# Patient Record
Sex: Male | Born: 1955
Health system: Southern US, Community
[De-identification: ages and names within clinical notes are randomized; demographics above are authoritative.]

## PROBLEM LIST (undated history)

## (undated) DIAGNOSIS — E119 Type 2 diabetes mellitus without complications: Secondary | ICD-10-CM

## (undated) DIAGNOSIS — H548 Legal blindness, as defined in USA: Secondary | ICD-10-CM

## (undated) DIAGNOSIS — M5030 Other cervical disc degeneration, unspecified cervical region: Secondary | ICD-10-CM

## (undated) DIAGNOSIS — J449 Chronic obstructive pulmonary disease, unspecified: Secondary | ICD-10-CM

---

## 2009-05-10 ENCOUNTER — Encounter: Admission: RE | Admit: 2009-05-10 | Discharge: 2009-05-10 | Payer: Self-pay | Admitting: Neurosurgery

## 2019-04-12 DIAGNOSIS — M48062 Spinal stenosis, lumbar region with neurogenic claudication: Secondary | ICD-10-CM | POA: Diagnosis not present

## 2019-04-12 DIAGNOSIS — F17210 Nicotine dependence, cigarettes, uncomplicated: Secondary | ICD-10-CM | POA: Diagnosis not present

## 2019-04-12 DIAGNOSIS — J439 Emphysema, unspecified: Secondary | ICD-10-CM | POA: Diagnosis not present

## 2019-04-12 DIAGNOSIS — J449 Chronic obstructive pulmonary disease, unspecified: Secondary | ICD-10-CM | POA: Diagnosis not present

## 2019-04-12 DIAGNOSIS — Z299 Encounter for prophylactic measures, unspecified: Secondary | ICD-10-CM | POA: Diagnosis not present

## 2019-04-12 DIAGNOSIS — I10 Essential (primary) hypertension: Secondary | ICD-10-CM | POA: Diagnosis not present

## 2019-04-14 DIAGNOSIS — H2513 Age-related nuclear cataract, bilateral: Secondary | ICD-10-CM | POA: Diagnosis not present

## 2019-04-14 DIAGNOSIS — H353213 Exudative age-related macular degeneration, right eye, with inactive scar: Secondary | ICD-10-CM | POA: Diagnosis not present

## 2019-04-14 DIAGNOSIS — H353221 Exudative age-related macular degeneration, left eye, with active choroidal neovascularization: Secondary | ICD-10-CM | POA: Diagnosis not present

## 2019-05-12 DIAGNOSIS — H353221 Exudative age-related macular degeneration, left eye, with active choroidal neovascularization: Secondary | ICD-10-CM | POA: Diagnosis not present

## 2019-05-12 DIAGNOSIS — H353213 Exudative age-related macular degeneration, right eye, with inactive scar: Secondary | ICD-10-CM | POA: Diagnosis not present

## 2019-05-12 DIAGNOSIS — H2513 Age-related nuclear cataract, bilateral: Secondary | ICD-10-CM | POA: Diagnosis not present

## 2019-05-13 DIAGNOSIS — M48062 Spinal stenosis, lumbar region with neurogenic claudication: Secondary | ICD-10-CM | POA: Diagnosis not present

## 2019-05-13 DIAGNOSIS — F17210 Nicotine dependence, cigarettes, uncomplicated: Secondary | ICD-10-CM | POA: Diagnosis not present

## 2019-05-13 DIAGNOSIS — D692 Other nonthrombocytopenic purpura: Secondary | ICD-10-CM | POA: Diagnosis not present

## 2019-05-13 DIAGNOSIS — I10 Essential (primary) hypertension: Secondary | ICD-10-CM | POA: Diagnosis not present

## 2019-05-13 DIAGNOSIS — H353291 Exudative age-related macular degeneration, unspecified eye, with active choroidal neovascularization: Secondary | ICD-10-CM | POA: Diagnosis not present

## 2019-05-13 DIAGNOSIS — Z299 Encounter for prophylactic measures, unspecified: Secondary | ICD-10-CM | POA: Diagnosis not present

## 2019-06-10 DIAGNOSIS — J439 Emphysema, unspecified: Secondary | ICD-10-CM | POA: Diagnosis not present

## 2019-06-10 DIAGNOSIS — F17210 Nicotine dependence, cigarettes, uncomplicated: Secondary | ICD-10-CM | POA: Diagnosis not present

## 2019-06-10 DIAGNOSIS — I10 Essential (primary) hypertension: Secondary | ICD-10-CM | POA: Diagnosis not present

## 2019-06-10 DIAGNOSIS — Z299 Encounter for prophylactic measures, unspecified: Secondary | ICD-10-CM | POA: Diagnosis not present

## 2019-06-10 DIAGNOSIS — M48062 Spinal stenosis, lumbar region with neurogenic claudication: Secondary | ICD-10-CM | POA: Diagnosis not present

## 2019-06-10 DIAGNOSIS — J449 Chronic obstructive pulmonary disease, unspecified: Secondary | ICD-10-CM | POA: Diagnosis not present

## 2019-06-14 DIAGNOSIS — H2513 Age-related nuclear cataract, bilateral: Secondary | ICD-10-CM | POA: Diagnosis not present

## 2019-06-14 DIAGNOSIS — H353231 Exudative age-related macular degeneration, bilateral, with active choroidal neovascularization: Secondary | ICD-10-CM | POA: Diagnosis not present

## 2019-06-25 DIAGNOSIS — F17210 Nicotine dependence, cigarettes, uncomplicated: Secondary | ICD-10-CM | POA: Diagnosis not present

## 2019-06-25 DIAGNOSIS — J449 Chronic obstructive pulmonary disease, unspecified: Secondary | ICD-10-CM | POA: Diagnosis not present

## 2019-06-25 DIAGNOSIS — I10 Essential (primary) hypertension: Secondary | ICD-10-CM | POA: Diagnosis not present

## 2019-06-25 DIAGNOSIS — H353291 Exudative age-related macular degeneration, unspecified eye, with active choroidal neovascularization: Secondary | ICD-10-CM | POA: Diagnosis not present

## 2019-06-25 DIAGNOSIS — Z299 Encounter for prophylactic measures, unspecified: Secondary | ICD-10-CM | POA: Diagnosis not present

## 2019-07-12 DIAGNOSIS — F17210 Nicotine dependence, cigarettes, uncomplicated: Secondary | ICD-10-CM | POA: Diagnosis not present

## 2019-07-12 DIAGNOSIS — I10 Essential (primary) hypertension: Secondary | ICD-10-CM | POA: Diagnosis not present

## 2019-07-12 DIAGNOSIS — H353291 Exudative age-related macular degeneration, unspecified eye, with active choroidal neovascularization: Secondary | ICD-10-CM | POA: Diagnosis not present

## 2019-07-12 DIAGNOSIS — J449 Chronic obstructive pulmonary disease, unspecified: Secondary | ICD-10-CM | POA: Diagnosis not present

## 2019-07-12 DIAGNOSIS — M48062 Spinal stenosis, lumbar region with neurogenic claudication: Secondary | ICD-10-CM | POA: Diagnosis not present

## 2019-07-12 DIAGNOSIS — Z299 Encounter for prophylactic measures, unspecified: Secondary | ICD-10-CM | POA: Diagnosis not present

## 2019-07-14 DIAGNOSIS — H2513 Age-related nuclear cataract, bilateral: Secondary | ICD-10-CM | POA: Diagnosis not present

## 2019-07-14 DIAGNOSIS — H353231 Exudative age-related macular degeneration, bilateral, with active choroidal neovascularization: Secondary | ICD-10-CM | POA: Diagnosis not present

## 2019-07-30 DIAGNOSIS — E781 Pure hyperglyceridemia: Secondary | ICD-10-CM | POA: Diagnosis not present

## 2019-07-30 DIAGNOSIS — I10 Essential (primary) hypertension: Secondary | ICD-10-CM | POA: Diagnosis not present

## 2019-08-11 DIAGNOSIS — H2513 Age-related nuclear cataract, bilateral: Secondary | ICD-10-CM | POA: Diagnosis not present

## 2019-08-11 DIAGNOSIS — H353231 Exudative age-related macular degeneration, bilateral, with active choroidal neovascularization: Secondary | ICD-10-CM | POA: Diagnosis not present

## 2019-08-13 DIAGNOSIS — M48062 Spinal stenosis, lumbar region with neurogenic claudication: Secondary | ICD-10-CM | POA: Diagnosis not present

## 2019-08-13 DIAGNOSIS — Z1211 Encounter for screening for malignant neoplasm of colon: Secondary | ICD-10-CM | POA: Diagnosis not present

## 2019-08-13 DIAGNOSIS — Z79899 Other long term (current) drug therapy: Secondary | ICD-10-CM | POA: Diagnosis not present

## 2019-08-13 DIAGNOSIS — I10 Essential (primary) hypertension: Secondary | ICD-10-CM | POA: Diagnosis not present

## 2019-08-13 DIAGNOSIS — Z299 Encounter for prophylactic measures, unspecified: Secondary | ICD-10-CM | POA: Diagnosis not present

## 2019-08-13 DIAGNOSIS — Z7189 Other specified counseling: Secondary | ICD-10-CM | POA: Diagnosis not present

## 2019-08-13 DIAGNOSIS — R5383 Other fatigue: Secondary | ICD-10-CM | POA: Diagnosis not present

## 2019-08-13 DIAGNOSIS — Z0000 Encounter for general adult medical examination without abnormal findings: Secondary | ICD-10-CM | POA: Diagnosis not present

## 2019-08-13 DIAGNOSIS — E781 Pure hyperglyceridemia: Secondary | ICD-10-CM | POA: Diagnosis not present

## 2019-09-03 ENCOUNTER — Encounter (HOSPITAL_COMMUNITY): Payer: Self-pay

## 2019-09-03 ENCOUNTER — Other Ambulatory Visit: Payer: Self-pay

## 2019-09-03 ENCOUNTER — Emergency Department (HOSPITAL_COMMUNITY)
Admission: EM | Admit: 2019-09-03 | Discharge: 2019-09-03 | Disposition: A | Discharge status: Home / Self Care | Payer: Medicare Other | Attending: Emergency Medicine | Admitting: Emergency Medicine

## 2019-09-03 ENCOUNTER — Emergency Department (HOSPITAL_COMMUNITY): Payer: Medicare Other

## 2019-09-03 DIAGNOSIS — R05 Cough: Secondary | ICD-10-CM | POA: Diagnosis not present

## 2019-09-03 DIAGNOSIS — Y999 Unspecified external cause status: Secondary | ICD-10-CM | POA: Diagnosis not present

## 2019-09-03 DIAGNOSIS — S5012XA Contusion of left forearm, initial encounter: Secondary | ICD-10-CM | POA: Diagnosis not present

## 2019-09-03 DIAGNOSIS — Y9355 Activity, bike riding: Secondary | ICD-10-CM | POA: Diagnosis not present

## 2019-09-03 DIAGNOSIS — J449 Chronic obstructive pulmonary disease, unspecified: Secondary | ICD-10-CM | POA: Insufficient documentation

## 2019-09-03 DIAGNOSIS — S7012XA Contusion of left thigh, initial encounter: Secondary | ICD-10-CM | POA: Insufficient documentation

## 2019-09-03 DIAGNOSIS — Y929 Unspecified place or not applicable: Secondary | ICD-10-CM | POA: Insufficient documentation

## 2019-09-03 DIAGNOSIS — R000 Tachycardia, unspecified: Secondary | ICD-10-CM | POA: Diagnosis not present

## 2019-09-03 DIAGNOSIS — Z23 Encounter for immunization: Secondary | ICD-10-CM | POA: Diagnosis not present

## 2019-09-03 DIAGNOSIS — S299XXA Unspecified injury of thorax, initial encounter: Secondary | ICD-10-CM | POA: Diagnosis present

## 2019-09-03 DIAGNOSIS — S199XXA Unspecified injury of neck, initial encounter: Secondary | ICD-10-CM | POA: Diagnosis not present

## 2019-09-03 DIAGNOSIS — S2232XA Fracture of one rib, left side, initial encounter for closed fracture: Secondary | ICD-10-CM | POA: Diagnosis not present

## 2019-09-03 DIAGNOSIS — F17200 Nicotine dependence, unspecified, uncomplicated: Secondary | ICD-10-CM | POA: Diagnosis not present

## 2019-09-03 HISTORY — DX: Legal blindness, as defined in USA: H54.8

## 2019-09-03 HISTORY — DX: Other cervical disc degeneration, unspecified cervical region: M50.30

## 2019-09-03 HISTORY — DX: Chronic obstructive pulmonary disease, unspecified: J44.9

## 2019-09-03 MED ORDER — TETANUS-DIPHTH-ACELL PERTUSSIS 5-2.5-18.5 LF-MCG/0.5 IM SUSP
0.5000 mL | Freq: Once | INTRAMUSCULAR | Status: AC
  Administered 2019-09-03: 0.5 mL via INTRAMUSCULAR
  Filled 2019-09-03: qty 0.5

## 2019-09-03 MED ORDER — OXYCODONE-ACETAMINOPHEN 5-325 MG PO TABS: 1.0000 | ORAL_TABLET | ORAL | 0 refills | Status: AC | PRN
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
## 2019-09-03 NOTE — Discharge Instructions (Addendum)
Return if any problems.  See your Physician for recheck.  Stop hydrocodone  Take percocet for pain.  Discuss on going pain management with your MD

## 2019-09-03 NOTE — ED Triage Notes (Signed)
Pt fell last weekend and believes he has a broken rib. Pt is in NAD. Left rib pain.

## 2019-09-07 DIAGNOSIS — Z299 Encounter for prophylactic measures, unspecified: Secondary | ICD-10-CM | POA: Diagnosis not present

## 2019-09-07 DIAGNOSIS — M48062 Spinal stenosis, lumbar region with neurogenic claudication: Secondary | ICD-10-CM | POA: Diagnosis not present

## 2019-09-07 DIAGNOSIS — R0602 Shortness of breath: Secondary | ICD-10-CM | POA: Diagnosis not present

## 2019-09-07 DIAGNOSIS — J449 Chronic obstructive pulmonary disease, unspecified: Secondary | ICD-10-CM | POA: Diagnosis not present

## 2019-09-07 DIAGNOSIS — F17210 Nicotine dependence, cigarettes, uncomplicated: Secondary | ICD-10-CM | POA: Diagnosis not present

## 2019-09-07 DIAGNOSIS — S2232XA Fracture of one rib, left side, initial encounter for closed fracture: Secondary | ICD-10-CM | POA: Diagnosis not present

## 2019-09-08 DIAGNOSIS — H353231 Exudative age-related macular degeneration, bilateral, with active choroidal neovascularization: Secondary | ICD-10-CM | POA: Diagnosis not present

## 2019-09-08 DIAGNOSIS — H43811 Vitreous degeneration, right eye: Secondary | ICD-10-CM | POA: Diagnosis not present

## 2019-09-08 DIAGNOSIS — H2513 Age-related nuclear cataract, bilateral: Secondary | ICD-10-CM | POA: Diagnosis not present

## 2019-09-13 DIAGNOSIS — J439 Emphysema, unspecified: Secondary | ICD-10-CM | POA: Diagnosis not present

## 2019-09-13 DIAGNOSIS — J449 Chronic obstructive pulmonary disease, unspecified: Secondary | ICD-10-CM | POA: Diagnosis not present

## 2019-09-13 DIAGNOSIS — Z981 Arthrodesis status: Secondary | ICD-10-CM | POA: Diagnosis not present

## 2019-09-13 DIAGNOSIS — F17210 Nicotine dependence, cigarettes, uncomplicated: Secondary | ICD-10-CM | POA: Diagnosis not present

## 2019-09-13 DIAGNOSIS — Z299 Encounter for prophylactic measures, unspecified: Secondary | ICD-10-CM | POA: Diagnosis not present

## 2019-09-13 DIAGNOSIS — H353291 Exudative age-related macular degeneration, unspecified eye, with active choroidal neovascularization: Secondary | ICD-10-CM | POA: Diagnosis not present

## 2019-09-13 DIAGNOSIS — I10 Essential (primary) hypertension: Secondary | ICD-10-CM | POA: Diagnosis not present

## 2019-09-29 DIAGNOSIS — Z720 Tobacco use: Secondary | ICD-10-CM | POA: Diagnosis not present

## 2019-09-29 DIAGNOSIS — I10 Essential (primary) hypertension: Secondary | ICD-10-CM | POA: Diagnosis not present

## 2019-09-29 DIAGNOSIS — E785 Hyperlipidemia, unspecified: Secondary | ICD-10-CM | POA: Diagnosis not present

## 2019-09-29 DIAGNOSIS — J449 Chronic obstructive pulmonary disease, unspecified: Secondary | ICD-10-CM | POA: Diagnosis not present

## 2019-10-11 DIAGNOSIS — B351 Tinea unguium: Secondary | ICD-10-CM | POA: Diagnosis not present

## 2019-10-11 DIAGNOSIS — I10 Essential (primary) hypertension: Secondary | ICD-10-CM | POA: Diagnosis not present

## 2019-10-11 DIAGNOSIS — F17210 Nicotine dependence, cigarettes, uncomplicated: Secondary | ICD-10-CM | POA: Diagnosis not present

## 2019-10-11 DIAGNOSIS — M48062 Spinal stenosis, lumbar region with neurogenic claudication: Secondary | ICD-10-CM | POA: Diagnosis not present

## 2019-10-11 DIAGNOSIS — Z299 Encounter for prophylactic measures, unspecified: Secondary | ICD-10-CM | POA: Diagnosis not present

## 2019-10-11 DIAGNOSIS — H353291 Exudative age-related macular degeneration, unspecified eye, with active choroidal neovascularization: Secondary | ICD-10-CM | POA: Diagnosis not present

## 2019-10-13 DIAGNOSIS — H353231 Exudative age-related macular degeneration, bilateral, with active choroidal neovascularization: Secondary | ICD-10-CM | POA: Diagnosis not present

## 2019-10-13 DIAGNOSIS — H353221 Exudative age-related macular degeneration, left eye, with active choroidal neovascularization: Secondary | ICD-10-CM | POA: Diagnosis not present

## 2019-10-13 DIAGNOSIS — H43811 Vitreous degeneration, right eye: Secondary | ICD-10-CM | POA: Diagnosis not present

## 2019-10-13 DIAGNOSIS — H353211 Exudative age-related macular degeneration, right eye, with active choroidal neovascularization: Secondary | ICD-10-CM | POA: Diagnosis not present

## 2019-10-13 DIAGNOSIS — H2513 Age-related nuclear cataract, bilateral: Secondary | ICD-10-CM | POA: Diagnosis not present

## 2019-10-29 DIAGNOSIS — M1711 Unilateral primary osteoarthritis, right knee: Secondary | ICD-10-CM | POA: Diagnosis not present

## 2019-10-29 DIAGNOSIS — I10 Essential (primary) hypertension: Secondary | ICD-10-CM | POA: Diagnosis not present

## 2019-10-29 DIAGNOSIS — Z720 Tobacco use: Secondary | ICD-10-CM | POA: Diagnosis not present

## 2019-10-29 DIAGNOSIS — J449 Chronic obstructive pulmonary disease, unspecified: Secondary | ICD-10-CM | POA: Diagnosis not present

## 2019-11-02 DIAGNOSIS — I739 Peripheral vascular disease, unspecified: Secondary | ICD-10-CM | POA: Diagnosis not present

## 2019-11-02 DIAGNOSIS — L110 Acquired keratosis follicularis: Secondary | ICD-10-CM | POA: Diagnosis not present

## 2019-11-02 DIAGNOSIS — M79672 Pain in left foot: Secondary | ICD-10-CM | POA: Diagnosis not present

## 2019-11-02 DIAGNOSIS — M79671 Pain in right foot: Secondary | ICD-10-CM | POA: Diagnosis not present

## 2019-11-02 DIAGNOSIS — M79674 Pain in right toe(s): Secondary | ICD-10-CM | POA: Diagnosis not present

## 2019-11-11 DIAGNOSIS — Z299 Encounter for prophylactic measures, unspecified: Secondary | ICD-10-CM | POA: Diagnosis not present

## 2019-11-11 DIAGNOSIS — I700 Atherosclerosis of aorta: Secondary | ICD-10-CM | POA: Diagnosis not present

## 2019-11-11 DIAGNOSIS — R5383 Other fatigue: Secondary | ICD-10-CM | POA: Diagnosis not present

## 2019-11-11 DIAGNOSIS — J449 Chronic obstructive pulmonary disease, unspecified: Secondary | ICD-10-CM | POA: Diagnosis not present

## 2019-11-11 DIAGNOSIS — T733XXS Exhaustion due to excessive exertion, sequela: Secondary | ICD-10-CM | POA: Diagnosis not present

## 2019-11-11 DIAGNOSIS — M48062 Spinal stenosis, lumbar region with neurogenic claudication: Secondary | ICD-10-CM | POA: Diagnosis not present

## 2019-11-11 DIAGNOSIS — I10 Essential (primary) hypertension: Secondary | ICD-10-CM | POA: Diagnosis not present

## 2019-11-11 DIAGNOSIS — F17210 Nicotine dependence, cigarettes, uncomplicated: Secondary | ICD-10-CM | POA: Diagnosis not present

## 2019-11-11 DIAGNOSIS — Z7689 Persons encountering health services in other specified circumstances: Secondary | ICD-10-CM | POA: Diagnosis not present

## 2019-11-17 DIAGNOSIS — H353231 Exudative age-related macular degeneration, bilateral, with active choroidal neovascularization: Secondary | ICD-10-CM | POA: Diagnosis not present

## 2019-11-17 DIAGNOSIS — H2513 Age-related nuclear cataract, bilateral: Secondary | ICD-10-CM | POA: Diagnosis not present

## 2019-11-17 DIAGNOSIS — H43811 Vitreous degeneration, right eye: Secondary | ICD-10-CM | POA: Diagnosis not present

## 2019-11-18 DIAGNOSIS — K6389 Other specified diseases of intestine: Secondary | ICD-10-CM | POA: Diagnosis not present

## 2019-11-18 DIAGNOSIS — D125 Benign neoplasm of sigmoid colon: Secondary | ICD-10-CM | POA: Diagnosis not present

## 2019-11-18 DIAGNOSIS — D122 Benign neoplasm of ascending colon: Secondary | ICD-10-CM | POA: Diagnosis not present

## 2019-11-18 DIAGNOSIS — Z1211 Encounter for screening for malignant neoplasm of colon: Secondary | ICD-10-CM | POA: Diagnosis not present

## 2019-11-18 DIAGNOSIS — K648 Other hemorrhoids: Secondary | ICD-10-CM | POA: Diagnosis not present

## 2019-11-18 DIAGNOSIS — Z86010 Personal history of colonic polyps: Secondary | ICD-10-CM | POA: Diagnosis not present

## 2019-12-15 DIAGNOSIS — I10 Essential (primary) hypertension: Secondary | ICD-10-CM | POA: Diagnosis not present

## 2019-12-15 DIAGNOSIS — F17210 Nicotine dependence, cigarettes, uncomplicated: Secondary | ICD-10-CM | POA: Diagnosis not present

## 2019-12-15 DIAGNOSIS — M48062 Spinal stenosis, lumbar region with neurogenic claudication: Secondary | ICD-10-CM | POA: Diagnosis not present

## 2019-12-15 DIAGNOSIS — J439 Emphysema, unspecified: Secondary | ICD-10-CM | POA: Diagnosis not present

## 2019-12-15 DIAGNOSIS — Z299 Encounter for prophylactic measures, unspecified: Secondary | ICD-10-CM | POA: Diagnosis not present

## 2019-12-15 DIAGNOSIS — K219 Gastro-esophageal reflux disease without esophagitis: Secondary | ICD-10-CM | POA: Diagnosis not present

## 2019-12-27 DIAGNOSIS — H353231 Exudative age-related macular degeneration, bilateral, with active choroidal neovascularization: Secondary | ICD-10-CM | POA: Diagnosis not present

## 2019-12-27 DIAGNOSIS — H43811 Vitreous degeneration, right eye: Secondary | ICD-10-CM | POA: Diagnosis not present

## 2019-12-27 DIAGNOSIS — H2513 Age-related nuclear cataract, bilateral: Secondary | ICD-10-CM | POA: Diagnosis not present

## 2020-01-14 DIAGNOSIS — J439 Emphysema, unspecified: Secondary | ICD-10-CM | POA: Diagnosis not present

## 2020-01-14 DIAGNOSIS — Z299 Encounter for prophylactic measures, unspecified: Secondary | ICD-10-CM | POA: Diagnosis not present

## 2020-01-14 DIAGNOSIS — J449 Chronic obstructive pulmonary disease, unspecified: Secondary | ICD-10-CM | POA: Diagnosis not present

## 2020-01-14 DIAGNOSIS — I10 Essential (primary) hypertension: Secondary | ICD-10-CM | POA: Diagnosis not present

## 2020-01-14 DIAGNOSIS — F17210 Nicotine dependence, cigarettes, uncomplicated: Secondary | ICD-10-CM | POA: Diagnosis not present

## 2020-01-14 DIAGNOSIS — M48062 Spinal stenosis, lumbar region with neurogenic claudication: Secondary | ICD-10-CM | POA: Diagnosis not present

## 2020-01-18 DIAGNOSIS — I739 Peripheral vascular disease, unspecified: Secondary | ICD-10-CM | POA: Diagnosis not present

## 2020-01-18 DIAGNOSIS — M79674 Pain in right toe(s): Secondary | ICD-10-CM | POA: Diagnosis not present

## 2020-01-18 DIAGNOSIS — M79671 Pain in right foot: Secondary | ICD-10-CM | POA: Diagnosis not present

## 2020-01-18 DIAGNOSIS — L110 Acquired keratosis follicularis: Secondary | ICD-10-CM | POA: Diagnosis not present

## 2020-01-18 DIAGNOSIS — M79672 Pain in left foot: Secondary | ICD-10-CM | POA: Diagnosis not present

## 2020-01-28 DIAGNOSIS — Z720 Tobacco use: Secondary | ICD-10-CM | POA: Diagnosis not present

## 2020-01-28 DIAGNOSIS — J449 Chronic obstructive pulmonary disease, unspecified: Secondary | ICD-10-CM | POA: Diagnosis not present

## 2020-01-28 DIAGNOSIS — I10 Essential (primary) hypertension: Secondary | ICD-10-CM | POA: Diagnosis not present

## 2020-02-01 DIAGNOSIS — H543 Unqualified visual loss, both eyes: Secondary | ICD-10-CM | POA: Diagnosis not present

## 2020-02-01 DIAGNOSIS — H2513 Age-related nuclear cataract, bilateral: Secondary | ICD-10-CM | POA: Diagnosis not present

## 2020-02-01 DIAGNOSIS — H353231 Exudative age-related macular degeneration, bilateral, with active choroidal neovascularization: Secondary | ICD-10-CM | POA: Diagnosis not present

## 2020-02-01 DIAGNOSIS — H43811 Vitreous degeneration, right eye: Secondary | ICD-10-CM | POA: Diagnosis not present

## 2020-02-01 DIAGNOSIS — F17290 Nicotine dependence, other tobacco product, uncomplicated: Secondary | ICD-10-CM | POA: Diagnosis not present

## 2020-02-15 DIAGNOSIS — I700 Atherosclerosis of aorta: Secondary | ICD-10-CM | POA: Diagnosis not present

## 2020-02-15 DIAGNOSIS — J069 Acute upper respiratory infection, unspecified: Secondary | ICD-10-CM | POA: Diagnosis not present

## 2020-02-15 DIAGNOSIS — F17210 Nicotine dependence, cigarettes, uncomplicated: Secondary | ICD-10-CM | POA: Diagnosis not present

## 2020-02-15 DIAGNOSIS — M48062 Spinal stenosis, lumbar region with neurogenic claudication: Secondary | ICD-10-CM | POA: Diagnosis not present

## 2020-02-15 DIAGNOSIS — I10 Essential (primary) hypertension: Secondary | ICD-10-CM | POA: Diagnosis not present

## 2020-02-15 DIAGNOSIS — Z299 Encounter for prophylactic measures, unspecified: Secondary | ICD-10-CM | POA: Diagnosis not present

## 2020-02-29 DIAGNOSIS — I10 Essential (primary) hypertension: Secondary | ICD-10-CM | POA: Diagnosis not present

## 2020-02-29 DIAGNOSIS — E781 Pure hyperglyceridemia: Secondary | ICD-10-CM | POA: Diagnosis not present

## 2020-03-08 DIAGNOSIS — H2513 Age-related nuclear cataract, bilateral: Secondary | ICD-10-CM | POA: Diagnosis not present

## 2020-03-08 DIAGNOSIS — H43811 Vitreous degeneration, right eye: Secondary | ICD-10-CM | POA: Diagnosis not present

## 2020-03-08 DIAGNOSIS — H353231 Exudative age-related macular degeneration, bilateral, with active choroidal neovascularization: Secondary | ICD-10-CM | POA: Diagnosis not present

## 2020-03-16 DIAGNOSIS — G8929 Other chronic pain: Secondary | ICD-10-CM | POA: Diagnosis not present

## 2020-03-16 DIAGNOSIS — I10 Essential (primary) hypertension: Secondary | ICD-10-CM | POA: Diagnosis not present

## 2020-03-16 DIAGNOSIS — I700 Atherosclerosis of aorta: Secondary | ICD-10-CM | POA: Diagnosis not present

## 2020-03-16 DIAGNOSIS — Z79899 Other long term (current) drug therapy: Secondary | ICD-10-CM | POA: Diagnosis not present

## 2020-03-16 DIAGNOSIS — M549 Dorsalgia, unspecified: Secondary | ICD-10-CM | POA: Diagnosis not present

## 2020-03-16 DIAGNOSIS — Z299 Encounter for prophylactic measures, unspecified: Secondary | ICD-10-CM | POA: Diagnosis not present

## 2020-03-16 DIAGNOSIS — F17210 Nicotine dependence, cigarettes, uncomplicated: Secondary | ICD-10-CM | POA: Diagnosis not present

## 2020-03-31 DIAGNOSIS — I10 Essential (primary) hypertension: Secondary | ICD-10-CM | POA: Diagnosis not present

## 2020-03-31 DIAGNOSIS — E781 Pure hyperglyceridemia: Secondary | ICD-10-CM | POA: Diagnosis not present

## 2020-04-05 DIAGNOSIS — J449 Chronic obstructive pulmonary disease, unspecified: Secondary | ICD-10-CM | POA: Diagnosis not present

## 2020-04-05 DIAGNOSIS — K5732 Diverticulitis of large intestine without perforation or abscess without bleeding: Secondary | ICD-10-CM | POA: Diagnosis not present

## 2020-04-05 DIAGNOSIS — K648 Other hemorrhoids: Secondary | ICD-10-CM | POA: Diagnosis not present

## 2020-04-05 DIAGNOSIS — D123 Benign neoplasm of transverse colon: Secondary | ICD-10-CM | POA: Diagnosis not present

## 2020-04-05 DIAGNOSIS — I10 Essential (primary) hypertension: Secondary | ICD-10-CM | POA: Diagnosis not present

## 2020-04-05 DIAGNOSIS — Z87891 Personal history of nicotine dependence: Secondary | ICD-10-CM | POA: Diagnosis not present

## 2020-04-05 DIAGNOSIS — K633 Ulcer of intestine: Secondary | ICD-10-CM | POA: Diagnosis not present

## 2020-04-05 DIAGNOSIS — Z86010 Personal history of colonic polyps: Secondary | ICD-10-CM | POA: Diagnosis not present

## 2020-04-05 DIAGNOSIS — K6389 Other specified diseases of intestine: Secondary | ICD-10-CM | POA: Diagnosis not present

## 2020-04-05 DIAGNOSIS — K635 Polyp of colon: Secondary | ICD-10-CM | POA: Diagnosis not present

## 2020-04-05 DIAGNOSIS — D125 Benign neoplasm of sigmoid colon: Secondary | ICD-10-CM | POA: Diagnosis not present

## 2020-04-05 DIAGNOSIS — M1711 Unilateral primary osteoarthritis, right knee: Secondary | ICD-10-CM | POA: Diagnosis not present

## 2020-04-05 DIAGNOSIS — K5730 Diverticulosis of large intestine without perforation or abscess without bleeding: Secondary | ICD-10-CM | POA: Diagnosis not present

## 2020-04-05 DIAGNOSIS — M5136 Other intervertebral disc degeneration, lumbar region: Secondary | ICD-10-CM | POA: Diagnosis not present

## 2020-04-05 DIAGNOSIS — H548 Legal blindness, as defined in USA: Secondary | ICD-10-CM | POA: Diagnosis not present

## 2020-04-07 DIAGNOSIS — H353231 Exudative age-related macular degeneration, bilateral, with active choroidal neovascularization: Secondary | ICD-10-CM | POA: Diagnosis not present

## 2020-04-07 DIAGNOSIS — H43811 Vitreous degeneration, right eye: Secondary | ICD-10-CM | POA: Diagnosis not present

## 2020-04-07 DIAGNOSIS — H2513 Age-related nuclear cataract, bilateral: Secondary | ICD-10-CM | POA: Diagnosis not present

## 2020-04-11 DIAGNOSIS — M79671 Pain in right foot: Secondary | ICD-10-CM | POA: Diagnosis not present

## 2020-04-11 DIAGNOSIS — M79675 Pain in left toe(s): Secondary | ICD-10-CM | POA: Diagnosis not present

## 2020-04-11 DIAGNOSIS — I739 Peripheral vascular disease, unspecified: Secondary | ICD-10-CM | POA: Diagnosis not present

## 2020-04-11 DIAGNOSIS — L110 Acquired keratosis follicularis: Secondary | ICD-10-CM | POA: Diagnosis not present

## 2020-04-11 DIAGNOSIS — M79674 Pain in right toe(s): Secondary | ICD-10-CM | POA: Diagnosis not present

## 2020-04-11 DIAGNOSIS — M79672 Pain in left foot: Secondary | ICD-10-CM | POA: Diagnosis not present

## 2020-04-18 DIAGNOSIS — J449 Chronic obstructive pulmonary disease, unspecified: Secondary | ICD-10-CM | POA: Diagnosis not present

## 2020-04-18 DIAGNOSIS — I700 Atherosclerosis of aorta: Secondary | ICD-10-CM | POA: Diagnosis not present

## 2020-04-18 DIAGNOSIS — F17210 Nicotine dependence, cigarettes, uncomplicated: Secondary | ICD-10-CM | POA: Diagnosis not present

## 2020-04-18 DIAGNOSIS — M48062 Spinal stenosis, lumbar region with neurogenic claudication: Secondary | ICD-10-CM | POA: Diagnosis not present

## 2020-04-18 DIAGNOSIS — I10 Essential (primary) hypertension: Secondary | ICD-10-CM | POA: Diagnosis not present

## 2020-04-18 DIAGNOSIS — Z299 Encounter for prophylactic measures, unspecified: Secondary | ICD-10-CM | POA: Diagnosis not present

## 2020-05-01 DIAGNOSIS — E781 Pure hyperglyceridemia: Secondary | ICD-10-CM | POA: Diagnosis not present

## 2020-05-01 DIAGNOSIS — I10 Essential (primary) hypertension: Secondary | ICD-10-CM | POA: Diagnosis not present

## 2020-05-17 DIAGNOSIS — H2513 Age-related nuclear cataract, bilateral: Secondary | ICD-10-CM | POA: Diagnosis not present

## 2020-05-17 DIAGNOSIS — H353231 Exudative age-related macular degeneration, bilateral, with active choroidal neovascularization: Secondary | ICD-10-CM | POA: Diagnosis not present

## 2020-05-17 DIAGNOSIS — H43811 Vitreous degeneration, right eye: Secondary | ICD-10-CM | POA: Diagnosis not present

## 2020-05-19 DIAGNOSIS — F17210 Nicotine dependence, cigarettes, uncomplicated: Secondary | ICD-10-CM | POA: Diagnosis not present

## 2020-05-19 DIAGNOSIS — Z299 Encounter for prophylactic measures, unspecified: Secondary | ICD-10-CM | POA: Diagnosis not present

## 2020-05-19 DIAGNOSIS — M48062 Spinal stenosis, lumbar region with neurogenic claudication: Secondary | ICD-10-CM | POA: Diagnosis not present

## 2020-05-19 DIAGNOSIS — I10 Essential (primary) hypertension: Secondary | ICD-10-CM | POA: Diagnosis not present

## 2020-06-16 DIAGNOSIS — H2513 Age-related nuclear cataract, bilateral: Secondary | ICD-10-CM | POA: Diagnosis not present

## 2020-06-16 DIAGNOSIS — H43811 Vitreous degeneration, right eye: Secondary | ICD-10-CM | POA: Diagnosis not present

## 2020-06-16 DIAGNOSIS — H353231 Exudative age-related macular degeneration, bilateral, with active choroidal neovascularization: Secondary | ICD-10-CM | POA: Diagnosis not present

## 2020-06-19 DIAGNOSIS — J449 Chronic obstructive pulmonary disease, unspecified: Secondary | ICD-10-CM | POA: Diagnosis not present

## 2020-06-19 DIAGNOSIS — Z299 Encounter for prophylactic measures, unspecified: Secondary | ICD-10-CM | POA: Diagnosis not present

## 2020-06-19 DIAGNOSIS — F17210 Nicotine dependence, cigarettes, uncomplicated: Secondary | ICD-10-CM | POA: Diagnosis not present

## 2020-06-19 DIAGNOSIS — I700 Atherosclerosis of aorta: Secondary | ICD-10-CM | POA: Diagnosis not present

## 2020-06-19 DIAGNOSIS — M48062 Spinal stenosis, lumbar region with neurogenic claudication: Secondary | ICD-10-CM | POA: Diagnosis not present

## 2020-06-19 DIAGNOSIS — I10 Essential (primary) hypertension: Secondary | ICD-10-CM | POA: Diagnosis not present

## 2020-06-19 DIAGNOSIS — H353291 Exudative age-related macular degeneration, unspecified eye, with active choroidal neovascularization: Secondary | ICD-10-CM | POA: Diagnosis not present

## 2020-06-27 DIAGNOSIS — M79674 Pain in right toe(s): Secondary | ICD-10-CM | POA: Diagnosis not present

## 2020-06-27 DIAGNOSIS — M79675 Pain in left toe(s): Secondary | ICD-10-CM | POA: Diagnosis not present

## 2020-06-27 DIAGNOSIS — L110 Acquired keratosis follicularis: Secondary | ICD-10-CM | POA: Diagnosis not present

## 2020-06-27 DIAGNOSIS — I739 Peripheral vascular disease, unspecified: Secondary | ICD-10-CM | POA: Diagnosis not present

## 2020-06-27 DIAGNOSIS — M79672 Pain in left foot: Secondary | ICD-10-CM | POA: Diagnosis not present

## 2020-06-27 DIAGNOSIS — M79671 Pain in right foot: Secondary | ICD-10-CM | POA: Diagnosis not present

## 2020-06-28 DIAGNOSIS — I10 Essential (primary) hypertension: Secondary | ICD-10-CM | POA: Diagnosis not present

## 2020-07-21 DIAGNOSIS — M549 Dorsalgia, unspecified: Secondary | ICD-10-CM | POA: Diagnosis not present

## 2020-07-21 DIAGNOSIS — F17210 Nicotine dependence, cigarettes, uncomplicated: Secondary | ICD-10-CM | POA: Diagnosis not present

## 2020-07-21 DIAGNOSIS — I10 Essential (primary) hypertension: Secondary | ICD-10-CM | POA: Diagnosis not present

## 2020-07-21 DIAGNOSIS — Z299 Encounter for prophylactic measures, unspecified: Secondary | ICD-10-CM | POA: Diagnosis not present

## 2020-07-21 DIAGNOSIS — R059 Cough, unspecified: Secondary | ICD-10-CM | POA: Diagnosis not present

## 2020-07-21 DIAGNOSIS — J40 Bronchitis, not specified as acute or chronic: Secondary | ICD-10-CM | POA: Diagnosis not present

## 2020-07-24 DIAGNOSIS — H353231 Exudative age-related macular degeneration, bilateral, with active choroidal neovascularization: Secondary | ICD-10-CM | POA: Diagnosis not present

## 2020-07-24 DIAGNOSIS — H2513 Age-related nuclear cataract, bilateral: Secondary | ICD-10-CM | POA: Diagnosis not present

## 2020-07-24 DIAGNOSIS — H43811 Vitreous degeneration, right eye: Secondary | ICD-10-CM | POA: Diagnosis not present

## 2020-07-29 DIAGNOSIS — I10 Essential (primary) hypertension: Secondary | ICD-10-CM | POA: Diagnosis not present

## 2020-07-29 DIAGNOSIS — D509 Iron deficiency anemia, unspecified: Secondary | ICD-10-CM | POA: Diagnosis not present

## 2020-08-18 DIAGNOSIS — M549 Dorsalgia, unspecified: Secondary | ICD-10-CM | POA: Diagnosis not present

## 2020-08-18 DIAGNOSIS — Z789 Other specified health status: Secondary | ICD-10-CM | POA: Diagnosis not present

## 2020-08-18 DIAGNOSIS — F17210 Nicotine dependence, cigarettes, uncomplicated: Secondary | ICD-10-CM | POA: Diagnosis not present

## 2020-08-18 DIAGNOSIS — I10 Essential (primary) hypertension: Secondary | ICD-10-CM | POA: Diagnosis not present

## 2020-08-18 DIAGNOSIS — Z299 Encounter for prophylactic measures, unspecified: Secondary | ICD-10-CM | POA: Diagnosis not present

## 2020-08-18 DIAGNOSIS — M48062 Spinal stenosis, lumbar region with neurogenic claudication: Secondary | ICD-10-CM | POA: Diagnosis not present

## 2020-08-18 DIAGNOSIS — J449 Chronic obstructive pulmonary disease, unspecified: Secondary | ICD-10-CM | POA: Diagnosis not present

## 2020-08-18 DIAGNOSIS — Z79891 Long term (current) use of opiate analgesic: Secondary | ICD-10-CM | POA: Diagnosis not present

## 2020-09-01 DIAGNOSIS — H353231 Exudative age-related macular degeneration, bilateral, with active choroidal neovascularization: Secondary | ICD-10-CM | POA: Diagnosis not present

## 2020-09-01 DIAGNOSIS — H2513 Age-related nuclear cataract, bilateral: Secondary | ICD-10-CM | POA: Diagnosis not present

## 2020-09-01 DIAGNOSIS — H43811 Vitreous degeneration, right eye: Secondary | ICD-10-CM | POA: Diagnosis not present

## 2020-09-19 DIAGNOSIS — I739 Peripheral vascular disease, unspecified: Secondary | ICD-10-CM | POA: Diagnosis not present

## 2020-09-19 DIAGNOSIS — L110 Acquired keratosis follicularis: Secondary | ICD-10-CM | POA: Diagnosis not present

## 2020-09-19 DIAGNOSIS — M79675 Pain in left toe(s): Secondary | ICD-10-CM | POA: Diagnosis not present

## 2020-09-19 DIAGNOSIS — M79671 Pain in right foot: Secondary | ICD-10-CM | POA: Diagnosis not present

## 2020-09-19 DIAGNOSIS — M79674 Pain in right toe(s): Secondary | ICD-10-CM | POA: Diagnosis not present

## 2020-09-19 DIAGNOSIS — M79672 Pain in left foot: Secondary | ICD-10-CM | POA: Diagnosis not present

## 2020-09-22 DIAGNOSIS — M48062 Spinal stenosis, lumbar region with neurogenic claudication: Secondary | ICD-10-CM | POA: Diagnosis not present

## 2020-09-22 DIAGNOSIS — J441 Chronic obstructive pulmonary disease with (acute) exacerbation: Secondary | ICD-10-CM | POA: Diagnosis not present

## 2020-09-22 DIAGNOSIS — I10 Essential (primary) hypertension: Secondary | ICD-10-CM | POA: Diagnosis not present

## 2020-09-22 DIAGNOSIS — F17210 Nicotine dependence, cigarettes, uncomplicated: Secondary | ICD-10-CM | POA: Diagnosis not present

## 2020-09-22 DIAGNOSIS — Z299 Encounter for prophylactic measures, unspecified: Secondary | ICD-10-CM | POA: Diagnosis not present

## 2020-10-04 DIAGNOSIS — H43811 Vitreous degeneration, right eye: Secondary | ICD-10-CM | POA: Diagnosis not present

## 2020-10-04 DIAGNOSIS — H2510 Age-related nuclear cataract, unspecified eye: Secondary | ICD-10-CM | POA: Diagnosis not present

## 2020-10-04 DIAGNOSIS — H353231 Exudative age-related macular degeneration, bilateral, with active choroidal neovascularization: Secondary | ICD-10-CM | POA: Diagnosis not present

## 2020-10-04 DIAGNOSIS — H2513 Age-related nuclear cataract, bilateral: Secondary | ICD-10-CM | POA: Diagnosis not present

## 2020-10-23 DIAGNOSIS — F17210 Nicotine dependence, cigarettes, uncomplicated: Secondary | ICD-10-CM | POA: Diagnosis not present

## 2020-10-23 DIAGNOSIS — J449 Chronic obstructive pulmonary disease, unspecified: Secondary | ICD-10-CM | POA: Diagnosis not present

## 2020-10-23 DIAGNOSIS — I10 Essential (primary) hypertension: Secondary | ICD-10-CM | POA: Diagnosis not present

## 2020-10-23 DIAGNOSIS — M67432 Ganglion, left wrist: Secondary | ICD-10-CM | POA: Diagnosis not present

## 2020-10-23 DIAGNOSIS — G8929 Other chronic pain: Secondary | ICD-10-CM | POA: Diagnosis not present

## 2020-10-23 DIAGNOSIS — Z299 Encounter for prophylactic measures, unspecified: Secondary | ICD-10-CM | POA: Diagnosis not present

## 2020-10-23 DIAGNOSIS — M549 Dorsalgia, unspecified: Secondary | ICD-10-CM | POA: Diagnosis not present

## 2020-10-27 DIAGNOSIS — M25522 Pain in left elbow: Secondary | ICD-10-CM | POA: Diagnosis not present

## 2020-10-27 DIAGNOSIS — M67432 Ganglion, left wrist: Secondary | ICD-10-CM | POA: Diagnosis not present

## 2020-11-08 DIAGNOSIS — H2513 Age-related nuclear cataract, bilateral: Secondary | ICD-10-CM | POA: Diagnosis not present

## 2020-11-08 DIAGNOSIS — H43811 Vitreous degeneration, right eye: Secondary | ICD-10-CM | POA: Diagnosis not present

## 2020-11-08 DIAGNOSIS — H353231 Exudative age-related macular degeneration, bilateral, with active choroidal neovascularization: Secondary | ICD-10-CM | POA: Diagnosis not present

## 2020-11-14 DIAGNOSIS — M67432 Ganglion, left wrist: Secondary | ICD-10-CM | POA: Diagnosis not present

## 2020-11-23 DIAGNOSIS — I10 Essential (primary) hypertension: Secondary | ICD-10-CM | POA: Diagnosis not present

## 2020-11-23 DIAGNOSIS — F17210 Nicotine dependence, cigarettes, uncomplicated: Secondary | ICD-10-CM | POA: Diagnosis not present

## 2020-11-23 DIAGNOSIS — M549 Dorsalgia, unspecified: Secondary | ICD-10-CM | POA: Diagnosis not present

## 2020-11-23 DIAGNOSIS — Z299 Encounter for prophylactic measures, unspecified: Secondary | ICD-10-CM | POA: Diagnosis not present

## 2020-11-23 DIAGNOSIS — G8929 Other chronic pain: Secondary | ICD-10-CM | POA: Diagnosis not present

## 2020-11-23 DIAGNOSIS — J449 Chronic obstructive pulmonary disease, unspecified: Secondary | ICD-10-CM | POA: Diagnosis not present

## 2020-11-29 DIAGNOSIS — D509 Iron deficiency anemia, unspecified: Secondary | ICD-10-CM | POA: Diagnosis not present

## 2020-11-29 DIAGNOSIS — I10 Essential (primary) hypertension: Secondary | ICD-10-CM | POA: Diagnosis not present

## 2020-11-29 DIAGNOSIS — M545 Low back pain, unspecified: Secondary | ICD-10-CM | POA: Diagnosis not present

## 2020-12-05 DIAGNOSIS — M79672 Pain in left foot: Secondary | ICD-10-CM | POA: Diagnosis not present

## 2020-12-05 DIAGNOSIS — M79671 Pain in right foot: Secondary | ICD-10-CM | POA: Diagnosis not present

## 2020-12-05 DIAGNOSIS — M79675 Pain in left toe(s): Secondary | ICD-10-CM | POA: Diagnosis not present

## 2020-12-05 DIAGNOSIS — I739 Peripheral vascular disease, unspecified: Secondary | ICD-10-CM | POA: Diagnosis not present

## 2020-12-05 DIAGNOSIS — M79674 Pain in right toe(s): Secondary | ICD-10-CM | POA: Diagnosis not present

## 2020-12-05 DIAGNOSIS — L110 Acquired keratosis follicularis: Secondary | ICD-10-CM | POA: Diagnosis not present

## 2020-12-06 DIAGNOSIS — H353231 Exudative age-related macular degeneration, bilateral, with active choroidal neovascularization: Secondary | ICD-10-CM | POA: Diagnosis not present

## 2020-12-06 DIAGNOSIS — H43811 Vitreous degeneration, right eye: Secondary | ICD-10-CM | POA: Diagnosis not present

## 2020-12-06 DIAGNOSIS — H2513 Age-related nuclear cataract, bilateral: Secondary | ICD-10-CM | POA: Diagnosis not present

## 2020-12-08 DIAGNOSIS — J449 Chronic obstructive pulmonary disease, unspecified: Secondary | ICD-10-CM | POA: Diagnosis not present

## 2020-12-25 DIAGNOSIS — J449 Chronic obstructive pulmonary disease, unspecified: Secondary | ICD-10-CM | POA: Diagnosis not present

## 2020-12-25 DIAGNOSIS — G8929 Other chronic pain: Secondary | ICD-10-CM | POA: Diagnosis not present

## 2020-12-25 DIAGNOSIS — Z299 Encounter for prophylactic measures, unspecified: Secondary | ICD-10-CM | POA: Diagnosis not present

## 2020-12-25 DIAGNOSIS — I10 Essential (primary) hypertension: Secondary | ICD-10-CM | POA: Diagnosis not present

## 2020-12-25 DIAGNOSIS — F17210 Nicotine dependence, cigarettes, uncomplicated: Secondary | ICD-10-CM | POA: Diagnosis not present

## 2020-12-25 DIAGNOSIS — M549 Dorsalgia, unspecified: Secondary | ICD-10-CM | POA: Diagnosis not present

## 2021-01-05 DIAGNOSIS — M48061 Spinal stenosis, lumbar region without neurogenic claudication: Secondary | ICD-10-CM | POA: Diagnosis not present

## 2021-01-05 DIAGNOSIS — M5117 Intervertebral disc disorders with radiculopathy, lumbosacral region: Secondary | ICD-10-CM | POA: Diagnosis not present

## 2021-01-09 DIAGNOSIS — H353211 Exudative age-related macular degeneration, right eye, with active choroidal neovascularization: Secondary | ICD-10-CM | POA: Diagnosis not present

## 2021-01-09 DIAGNOSIS — H25813 Combined forms of age-related cataract, bilateral: Secondary | ICD-10-CM | POA: Diagnosis not present

## 2021-01-09 DIAGNOSIS — H353221 Exudative age-related macular degeneration, left eye, with active choroidal neovascularization: Secondary | ICD-10-CM | POA: Diagnosis not present

## 2021-01-10 DIAGNOSIS — H353231 Exudative age-related macular degeneration, bilateral, with active choroidal neovascularization: Secondary | ICD-10-CM | POA: Diagnosis not present

## 2021-01-10 DIAGNOSIS — H43811 Vitreous degeneration, right eye: Secondary | ICD-10-CM | POA: Diagnosis not present

## 2021-01-10 DIAGNOSIS — H25813 Combined forms of age-related cataract, bilateral: Secondary | ICD-10-CM | POA: Diagnosis not present

## 2021-01-18 DIAGNOSIS — M67432 Ganglion, left wrist: Secondary | ICD-10-CM | POA: Diagnosis not present

## 2021-01-22 DIAGNOSIS — G8929 Other chronic pain: Secondary | ICD-10-CM | POA: Diagnosis not present

## 2021-01-22 DIAGNOSIS — F17210 Nicotine dependence, cigarettes, uncomplicated: Secondary | ICD-10-CM | POA: Diagnosis not present

## 2021-01-22 DIAGNOSIS — J441 Chronic obstructive pulmonary disease with (acute) exacerbation: Secondary | ICD-10-CM | POA: Diagnosis not present

## 2021-01-22 DIAGNOSIS — M549 Dorsalgia, unspecified: Secondary | ICD-10-CM | POA: Diagnosis not present

## 2021-01-22 DIAGNOSIS — Z299 Encounter for prophylactic measures, unspecified: Secondary | ICD-10-CM | POA: Diagnosis not present

## 2021-01-29 DIAGNOSIS — H353211 Exudative age-related macular degeneration, right eye, with active choroidal neovascularization: Secondary | ICD-10-CM | POA: Diagnosis not present

## 2021-02-01 DIAGNOSIS — M48062 Spinal stenosis, lumbar region with neurogenic claudication: Secondary | ICD-10-CM | POA: Diagnosis not present

## 2021-02-01 DIAGNOSIS — I10 Essential (primary) hypertension: Secondary | ICD-10-CM | POA: Diagnosis not present

## 2021-02-05 DIAGNOSIS — H353211 Exudative age-related macular degeneration, right eye, with active choroidal neovascularization: Secondary | ICD-10-CM | POA: Diagnosis not present

## 2021-02-05 DIAGNOSIS — H25813 Combined forms of age-related cataract, bilateral: Secondary | ICD-10-CM | POA: Diagnosis not present

## 2021-02-05 DIAGNOSIS — H25812 Combined forms of age-related cataract, left eye: Secondary | ICD-10-CM | POA: Diagnosis not present

## 2021-02-05 DIAGNOSIS — J449 Chronic obstructive pulmonary disease, unspecified: Secondary | ICD-10-CM | POA: Diagnosis not present

## 2021-02-06 DIAGNOSIS — Z48810 Encounter for surgical aftercare following surgery on the sense organs: Secondary | ICD-10-CM | POA: Diagnosis not present

## 2021-02-06 DIAGNOSIS — Z961 Presence of intraocular lens: Secondary | ICD-10-CM | POA: Diagnosis not present

## 2021-02-07 DIAGNOSIS — J449 Chronic obstructive pulmonary disease, unspecified: Secondary | ICD-10-CM | POA: Diagnosis not present

## 2021-02-13 DIAGNOSIS — H25811 Combined forms of age-related cataract, right eye: Secondary | ICD-10-CM | POA: Diagnosis not present

## 2021-02-13 DIAGNOSIS — Z961 Presence of intraocular lens: Secondary | ICD-10-CM | POA: Diagnosis not present

## 2021-02-13 DIAGNOSIS — Z48810 Encounter for surgical aftercare following surgery on the sense organs: Secondary | ICD-10-CM | POA: Diagnosis not present

## 2021-02-19 DIAGNOSIS — M79674 Pain in right toe(s): Secondary | ICD-10-CM | POA: Diagnosis not present

## 2021-02-19 DIAGNOSIS — M79671 Pain in right foot: Secondary | ICD-10-CM | POA: Diagnosis not present

## 2021-02-19 DIAGNOSIS — L110 Acquired keratosis follicularis: Secondary | ICD-10-CM | POA: Diagnosis not present

## 2021-02-19 DIAGNOSIS — I739 Peripheral vascular disease, unspecified: Secondary | ICD-10-CM | POA: Diagnosis not present

## 2021-02-19 DIAGNOSIS — M79672 Pain in left foot: Secondary | ICD-10-CM | POA: Diagnosis not present

## 2021-02-19 DIAGNOSIS — M79675 Pain in left toe(s): Secondary | ICD-10-CM | POA: Diagnosis not present

## 2021-02-23 DIAGNOSIS — F17210 Nicotine dependence, cigarettes, uncomplicated: Secondary | ICD-10-CM | POA: Diagnosis not present

## 2021-02-23 DIAGNOSIS — L738 Other specified follicular disorders: Secondary | ICD-10-CM | POA: Diagnosis not present

## 2021-02-23 DIAGNOSIS — I10 Essential (primary) hypertension: Secondary | ICD-10-CM | POA: Diagnosis not present

## 2021-02-23 DIAGNOSIS — J449 Chronic obstructive pulmonary disease, unspecified: Secondary | ICD-10-CM | POA: Diagnosis not present

## 2021-02-23 DIAGNOSIS — Z299 Encounter for prophylactic measures, unspecified: Secondary | ICD-10-CM | POA: Diagnosis not present

## 2021-02-28 DIAGNOSIS — Z961 Presence of intraocular lens: Secondary | ICD-10-CM | POA: Diagnosis not present

## 2021-02-28 DIAGNOSIS — H25811 Combined forms of age-related cataract, right eye: Secondary | ICD-10-CM | POA: Diagnosis not present

## 2021-02-28 DIAGNOSIS — H43811 Vitreous degeneration, right eye: Secondary | ICD-10-CM | POA: Diagnosis not present

## 2021-02-28 DIAGNOSIS — H353231 Exudative age-related macular degeneration, bilateral, with active choroidal neovascularization: Secondary | ICD-10-CM | POA: Diagnosis not present

## 2021-03-20 DIAGNOSIS — H25811 Combined forms of age-related cataract, right eye: Secondary | ICD-10-CM | POA: Diagnosis not present

## 2021-03-20 DIAGNOSIS — Z48810 Encounter for surgical aftercare following surgery on the sense organs: Secondary | ICD-10-CM | POA: Diagnosis not present

## 2021-03-20 DIAGNOSIS — Z961 Presence of intraocular lens: Secondary | ICD-10-CM | POA: Diagnosis not present

## 2021-03-28 DIAGNOSIS — F17210 Nicotine dependence, cigarettes, uncomplicated: Secondary | ICD-10-CM | POA: Diagnosis not present

## 2021-03-28 DIAGNOSIS — M25519 Pain in unspecified shoulder: Secondary | ICD-10-CM | POA: Diagnosis not present

## 2021-03-28 DIAGNOSIS — I10 Essential (primary) hypertension: Secondary | ICD-10-CM | POA: Diagnosis not present

## 2021-03-28 DIAGNOSIS — J449 Chronic obstructive pulmonary disease, unspecified: Secondary | ICD-10-CM | POA: Diagnosis not present

## 2021-03-28 DIAGNOSIS — M542 Cervicalgia: Secondary | ICD-10-CM | POA: Diagnosis not present

## 2021-03-28 DIAGNOSIS — Z2821 Immunization not carried out because of patient refusal: Secondary | ICD-10-CM | POA: Diagnosis not present

## 2021-03-28 DIAGNOSIS — Z299 Encounter for prophylactic measures, unspecified: Secondary | ICD-10-CM | POA: Diagnosis not present

## 2021-03-30 DIAGNOSIS — I10 Essential (primary) hypertension: Secondary | ICD-10-CM | POA: Diagnosis not present

## 2021-04-03 DIAGNOSIS — M25511 Pain in right shoulder: Secondary | ICD-10-CM | POA: Diagnosis not present

## 2021-04-03 DIAGNOSIS — M542 Cervicalgia: Secondary | ICD-10-CM | POA: Diagnosis not present

## 2021-04-04 DIAGNOSIS — H353231 Exudative age-related macular degeneration, bilateral, with active choroidal neovascularization: Secondary | ICD-10-CM | POA: Diagnosis not present

## 2021-04-04 DIAGNOSIS — H25811 Combined forms of age-related cataract, right eye: Secondary | ICD-10-CM | POA: Diagnosis not present

## 2021-04-04 DIAGNOSIS — Z961 Presence of intraocular lens: Secondary | ICD-10-CM | POA: Diagnosis not present

## 2021-04-04 DIAGNOSIS — H43811 Vitreous degeneration, right eye: Secondary | ICD-10-CM | POA: Diagnosis not present

## 2021-04-11 DIAGNOSIS — M9971 Connective tissue and disc stenosis of intervertebral foramina of cervical region: Secondary | ICD-10-CM | POA: Diagnosis not present

## 2021-04-11 DIAGNOSIS — M47812 Spondylosis without myelopathy or radiculopathy, cervical region: Secondary | ICD-10-CM | POA: Diagnosis not present

## 2021-04-11 DIAGNOSIS — T84498A Other mechanical complication of other internal orthopedic devices, implants and grafts, initial encounter: Secondary | ICD-10-CM | POA: Diagnosis not present

## 2021-04-11 DIAGNOSIS — M542 Cervicalgia: Secondary | ICD-10-CM | POA: Diagnosis not present

## 2021-04-11 DIAGNOSIS — I6523 Occlusion and stenosis of bilateral carotid arteries: Secondary | ICD-10-CM | POA: Diagnosis not present

## 2021-04-11 DIAGNOSIS — I739 Peripheral vascular disease, unspecified: Secondary | ICD-10-CM | POA: Diagnosis not present

## 2021-04-25 DIAGNOSIS — I6523 Occlusion and stenosis of bilateral carotid arteries: Secondary | ICD-10-CM | POA: Diagnosis not present

## 2021-04-25 DIAGNOSIS — Z299 Encounter for prophylactic measures, unspecified: Secondary | ICD-10-CM | POA: Diagnosis not present

## 2021-04-25 DIAGNOSIS — I10 Essential (primary) hypertension: Secondary | ICD-10-CM | POA: Diagnosis not present

## 2021-04-25 DIAGNOSIS — M48062 Spinal stenosis, lumbar region with neurogenic claudication: Secondary | ICD-10-CM | POA: Diagnosis not present

## 2021-04-25 DIAGNOSIS — I700 Atherosclerosis of aorta: Secondary | ICD-10-CM | POA: Diagnosis not present

## 2021-04-30 DIAGNOSIS — H25811 Combined forms of age-related cataract, right eye: Secondary | ICD-10-CM | POA: Diagnosis not present

## 2021-05-01 DIAGNOSIS — I10 Essential (primary) hypertension: Secondary | ICD-10-CM | POA: Diagnosis not present

## 2021-05-01 DIAGNOSIS — Z48810 Encounter for surgical aftercare following surgery on the sense organs: Secondary | ICD-10-CM | POA: Diagnosis not present

## 2021-05-01 DIAGNOSIS — Z961 Presence of intraocular lens: Secondary | ICD-10-CM | POA: Diagnosis not present

## 2021-05-07 DIAGNOSIS — M79671 Pain in right foot: Secondary | ICD-10-CM | POA: Diagnosis not present

## 2021-05-07 DIAGNOSIS — M79675 Pain in left toe(s): Secondary | ICD-10-CM | POA: Diagnosis not present

## 2021-05-07 DIAGNOSIS — M79674 Pain in right toe(s): Secondary | ICD-10-CM | POA: Diagnosis not present

## 2021-05-07 DIAGNOSIS — L110 Acquired keratosis follicularis: Secondary | ICD-10-CM | POA: Diagnosis not present

## 2021-05-07 DIAGNOSIS — I739 Peripheral vascular disease, unspecified: Secondary | ICD-10-CM | POA: Diagnosis not present

## 2021-05-07 DIAGNOSIS — M79672 Pain in left foot: Secondary | ICD-10-CM | POA: Diagnosis not present

## 2021-05-14 DIAGNOSIS — H43811 Vitreous degeneration, right eye: Secondary | ICD-10-CM | POA: Diagnosis not present

## 2021-05-14 DIAGNOSIS — Z961 Presence of intraocular lens: Secondary | ICD-10-CM | POA: Diagnosis not present

## 2021-05-14 DIAGNOSIS — H353231 Exudative age-related macular degeneration, bilateral, with active choroidal neovascularization: Secondary | ICD-10-CM | POA: Diagnosis not present

## 2021-05-28 DIAGNOSIS — Z299 Encounter for prophylactic measures, unspecified: Secondary | ICD-10-CM | POA: Diagnosis not present

## 2021-05-28 DIAGNOSIS — I10 Essential (primary) hypertension: Secondary | ICD-10-CM | POA: Diagnosis not present

## 2021-05-28 DIAGNOSIS — F17210 Nicotine dependence, cigarettes, uncomplicated: Secondary | ICD-10-CM | POA: Diagnosis not present

## 2021-05-28 DIAGNOSIS — M48062 Spinal stenosis, lumbar region with neurogenic claudication: Secondary | ICD-10-CM | POA: Diagnosis not present

## 2021-05-29 DIAGNOSIS — I10 Essential (primary) hypertension: Secondary | ICD-10-CM | POA: Diagnosis not present

## 2021-06-05 DIAGNOSIS — H353230 Exudative age-related macular degeneration, bilateral, stage unspecified: Secondary | ICD-10-CM | POA: Diagnosis not present

## 2021-06-05 DIAGNOSIS — Z48810 Encounter for surgical aftercare following surgery on the sense organs: Secondary | ICD-10-CM | POA: Diagnosis not present

## 2021-06-05 DIAGNOSIS — Z961 Presence of intraocular lens: Secondary | ICD-10-CM | POA: Diagnosis not present

## 2021-06-11 DIAGNOSIS — H43811 Vitreous degeneration, right eye: Secondary | ICD-10-CM | POA: Diagnosis not present

## 2021-06-11 DIAGNOSIS — Z961 Presence of intraocular lens: Secondary | ICD-10-CM | POA: Diagnosis not present

## 2021-06-11 DIAGNOSIS — H353231 Exudative age-related macular degeneration, bilateral, with active choroidal neovascularization: Secondary | ICD-10-CM | POA: Diagnosis not present

## 2021-06-25 DIAGNOSIS — J449 Chronic obstructive pulmonary disease, unspecified: Secondary | ICD-10-CM | POA: Diagnosis not present

## 2021-06-25 DIAGNOSIS — F17210 Nicotine dependence, cigarettes, uncomplicated: Secondary | ICD-10-CM | POA: Diagnosis not present

## 2021-06-25 DIAGNOSIS — I10 Essential (primary) hypertension: Secondary | ICD-10-CM | POA: Diagnosis not present

## 2021-06-25 DIAGNOSIS — Z299 Encounter for prophylactic measures, unspecified: Secondary | ICD-10-CM | POA: Diagnosis not present

## 2021-06-25 DIAGNOSIS — M48062 Spinal stenosis, lumbar region with neurogenic claudication: Secondary | ICD-10-CM | POA: Diagnosis not present

## 2021-07-08 DIAGNOSIS — J449 Chronic obstructive pulmonary disease, unspecified: Secondary | ICD-10-CM | POA: Diagnosis not present

## 2021-07-09 DIAGNOSIS — H43811 Vitreous degeneration, right eye: Secondary | ICD-10-CM | POA: Diagnosis not present

## 2021-07-09 DIAGNOSIS — H353231 Exudative age-related macular degeneration, bilateral, with active choroidal neovascularization: Secondary | ICD-10-CM | POA: Diagnosis not present

## 2021-07-09 DIAGNOSIS — Z961 Presence of intraocular lens: Secondary | ICD-10-CM | POA: Diagnosis not present

## 2021-07-23 DIAGNOSIS — L110 Acquired keratosis follicularis: Secondary | ICD-10-CM | POA: Diagnosis not present

## 2021-07-23 DIAGNOSIS — I739 Peripheral vascular disease, unspecified: Secondary | ICD-10-CM | POA: Diagnosis not present

## 2021-07-23 DIAGNOSIS — M79672 Pain in left foot: Secondary | ICD-10-CM | POA: Diagnosis not present

## 2021-07-23 DIAGNOSIS — M79675 Pain in left toe(s): Secondary | ICD-10-CM | POA: Diagnosis not present

## 2021-07-23 DIAGNOSIS — M79671 Pain in right foot: Secondary | ICD-10-CM | POA: Diagnosis not present

## 2021-07-23 DIAGNOSIS — M79674 Pain in right toe(s): Secondary | ICD-10-CM | POA: Diagnosis not present

## 2021-07-24 DIAGNOSIS — M5136 Other intervertebral disc degeneration, lumbar region: Secondary | ICD-10-CM | POA: Diagnosis not present

## 2021-07-24 DIAGNOSIS — Z981 Arthrodesis status: Secondary | ICD-10-CM | POA: Diagnosis not present

## 2021-07-24 DIAGNOSIS — M47816 Spondylosis without myelopathy or radiculopathy, lumbar region: Secondary | ICD-10-CM | POA: Diagnosis not present

## 2021-07-24 DIAGNOSIS — M542 Cervicalgia: Secondary | ICD-10-CM | POA: Diagnosis not present

## 2021-07-24 DIAGNOSIS — M48061 Spinal stenosis, lumbar region without neurogenic claudication: Secondary | ICD-10-CM | POA: Diagnosis not present

## 2021-07-24 DIAGNOSIS — M5031 Other cervical disc degeneration,  high cervical region: Secondary | ICD-10-CM | POA: Diagnosis not present

## 2021-07-24 DIAGNOSIS — M8580 Other specified disorders of bone density and structure, unspecified site: Secondary | ICD-10-CM | POA: Diagnosis not present

## 2021-07-26 DIAGNOSIS — I10 Essential (primary) hypertension: Secondary | ICD-10-CM | POA: Diagnosis not present

## 2021-07-26 DIAGNOSIS — M48062 Spinal stenosis, lumbar region with neurogenic claudication: Secondary | ICD-10-CM | POA: Diagnosis not present

## 2021-07-26 DIAGNOSIS — F17210 Nicotine dependence, cigarettes, uncomplicated: Secondary | ICD-10-CM | POA: Diagnosis not present

## 2021-07-26 DIAGNOSIS — Z299 Encounter for prophylactic measures, unspecified: Secondary | ICD-10-CM | POA: Diagnosis not present

## 2021-08-13 DIAGNOSIS — T84296D Other mechanical complication of internal fixation device of vertebrae, subsequent encounter: Secondary | ICD-10-CM | POA: Diagnosis not present

## 2021-08-13 DIAGNOSIS — M48062 Spinal stenosis, lumbar region with neurogenic claudication: Secondary | ICD-10-CM | POA: Diagnosis not present

## 2021-08-13 DIAGNOSIS — Z981 Arthrodesis status: Secondary | ICD-10-CM | POA: Diagnosis not present

## 2021-08-13 DIAGNOSIS — M960 Pseudarthrosis after fusion or arthrodesis: Secondary | ICD-10-CM | POA: Diagnosis not present

## 2021-08-15 DIAGNOSIS — Z79899 Other long term (current) drug therapy: Secondary | ICD-10-CM | POA: Diagnosis not present

## 2021-08-15 DIAGNOSIS — Z0000 Encounter for general adult medical examination without abnormal findings: Secondary | ICD-10-CM | POA: Diagnosis not present

## 2021-08-15 DIAGNOSIS — I10 Essential (primary) hypertension: Secondary | ICD-10-CM | POA: Diagnosis not present

## 2021-08-15 DIAGNOSIS — M48062 Spinal stenosis, lumbar region with neurogenic claudication: Secondary | ICD-10-CM | POA: Diagnosis not present

## 2021-08-15 DIAGNOSIS — R5383 Other fatigue: Secondary | ICD-10-CM | POA: Diagnosis not present

## 2021-08-15 DIAGNOSIS — Z7189 Other specified counseling: Secondary | ICD-10-CM | POA: Diagnosis not present

## 2021-08-15 DIAGNOSIS — Z299 Encounter for prophylactic measures, unspecified: Secondary | ICD-10-CM | POA: Diagnosis not present

## 2021-08-15 DIAGNOSIS — F17210 Nicotine dependence, cigarettes, uncomplicated: Secondary | ICD-10-CM | POA: Diagnosis not present

## 2021-08-25 IMAGING — DX DG CERVICAL SPINE COMPLETE 4+V
6 series · 6 of 6 positions shown · non-contrast
Comparison: None.

CLINICAL DATA: Pain following fall

EXAM:
CERVICAL SPINE - COMPLETE 4+ VIEW

[c-spine lat]
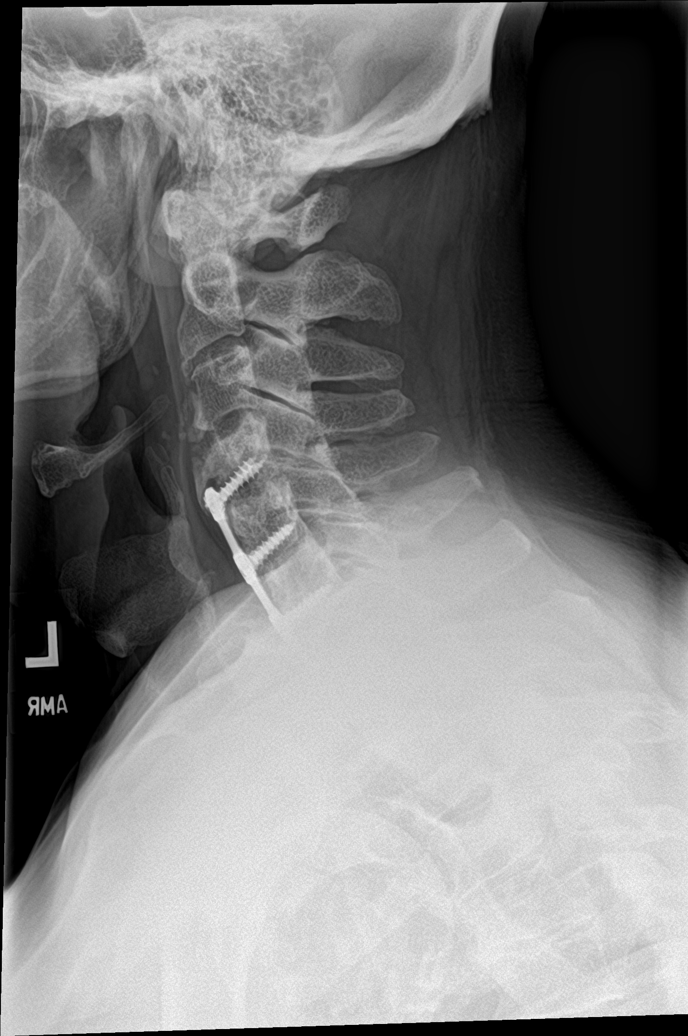

[c-spine obl (1 of 2)]
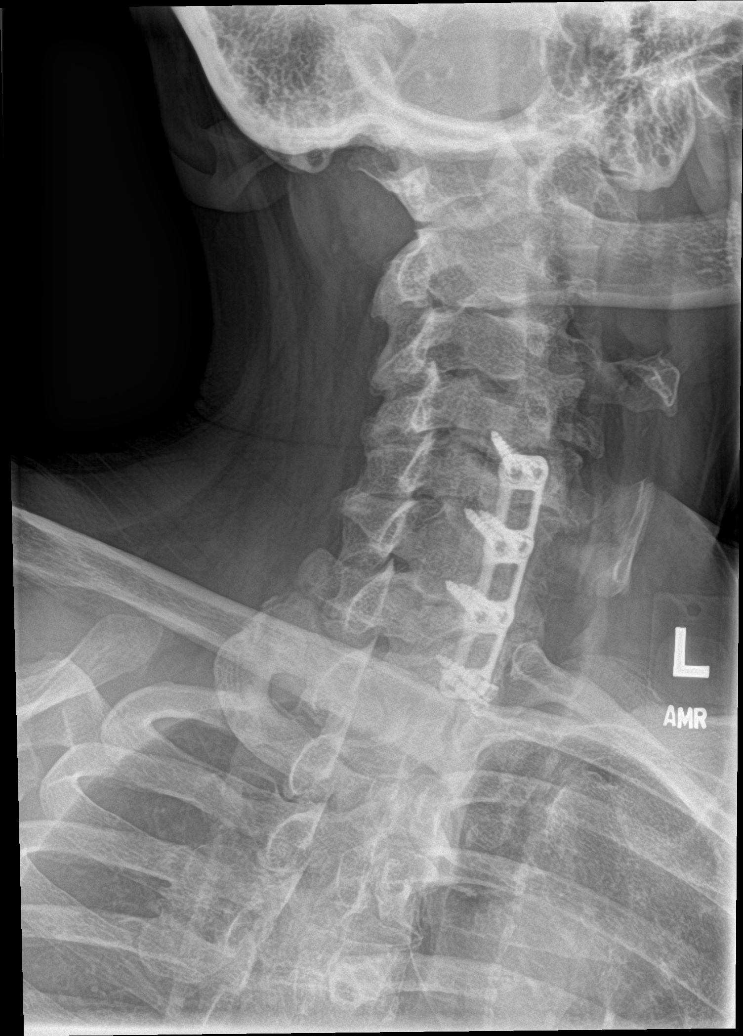

[c-spine obl (2 of 2)]
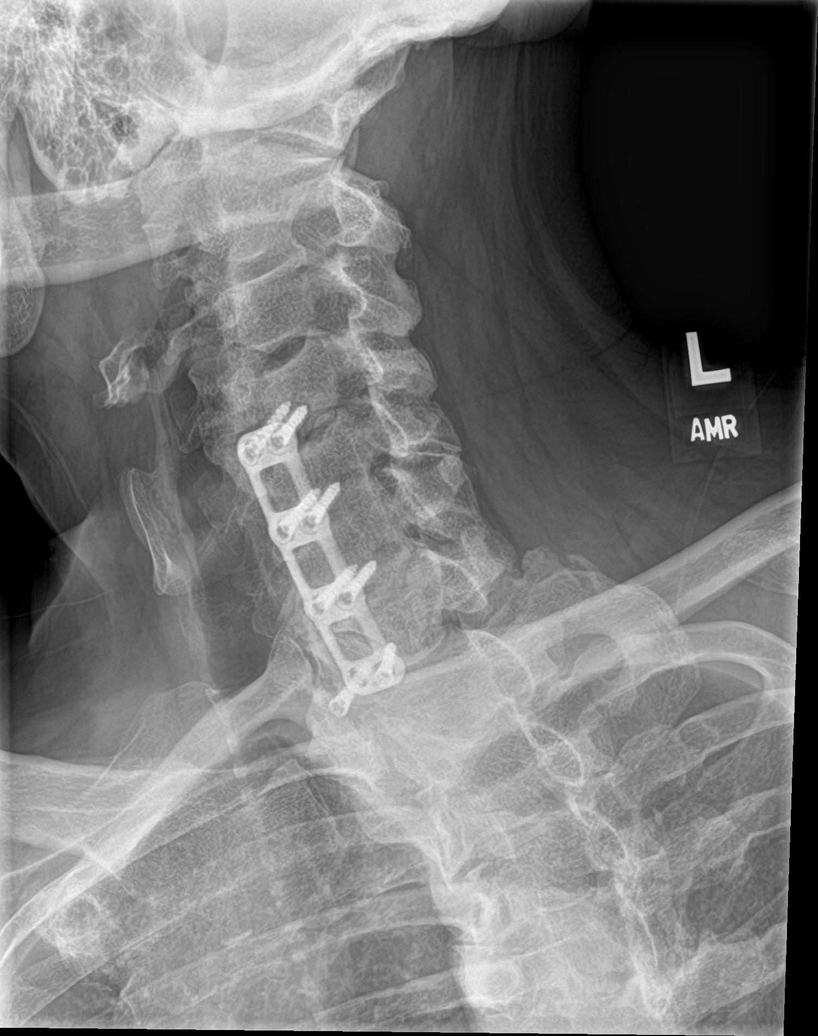

[c-spine ap]
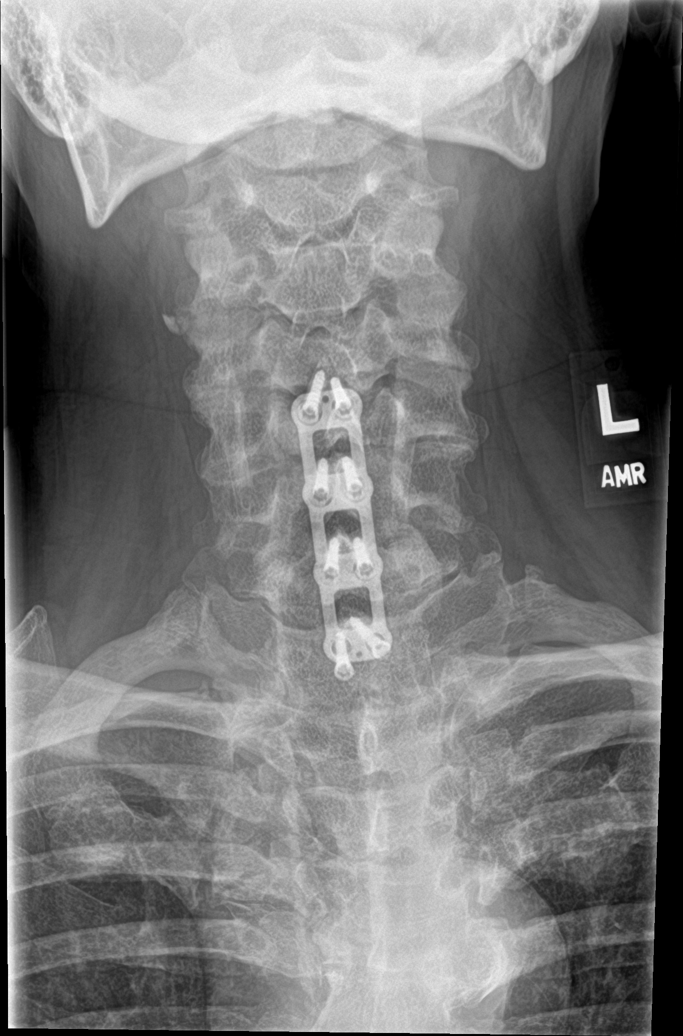

[c-spine open mouth]
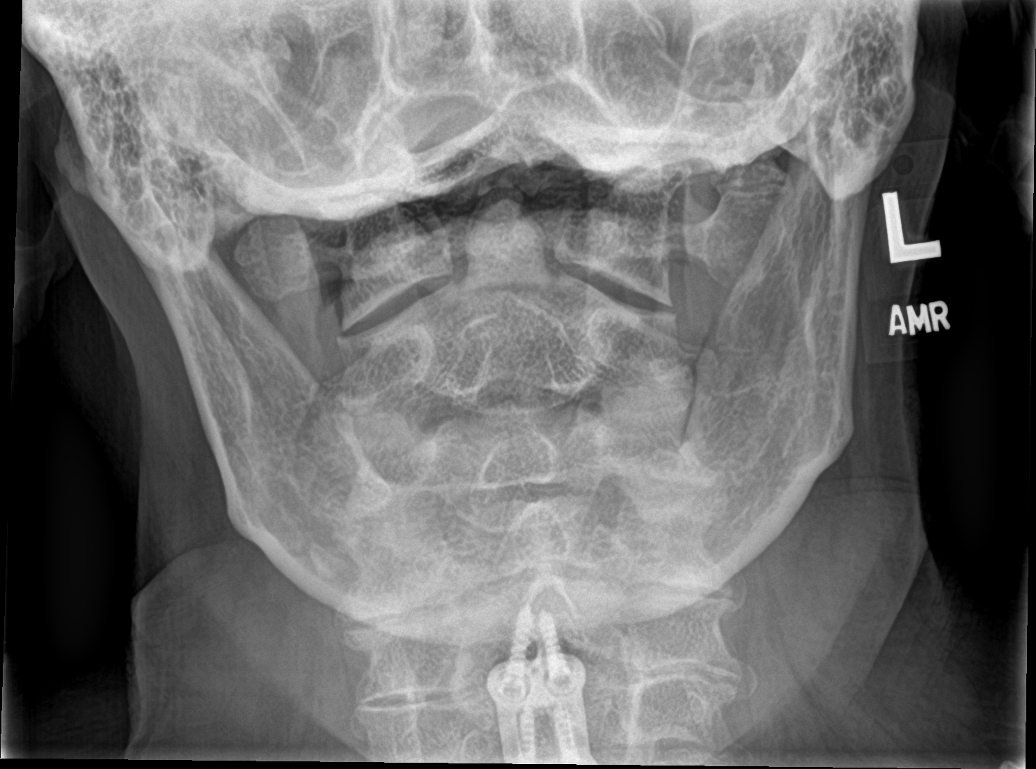

[c-spine swimmers trauma]
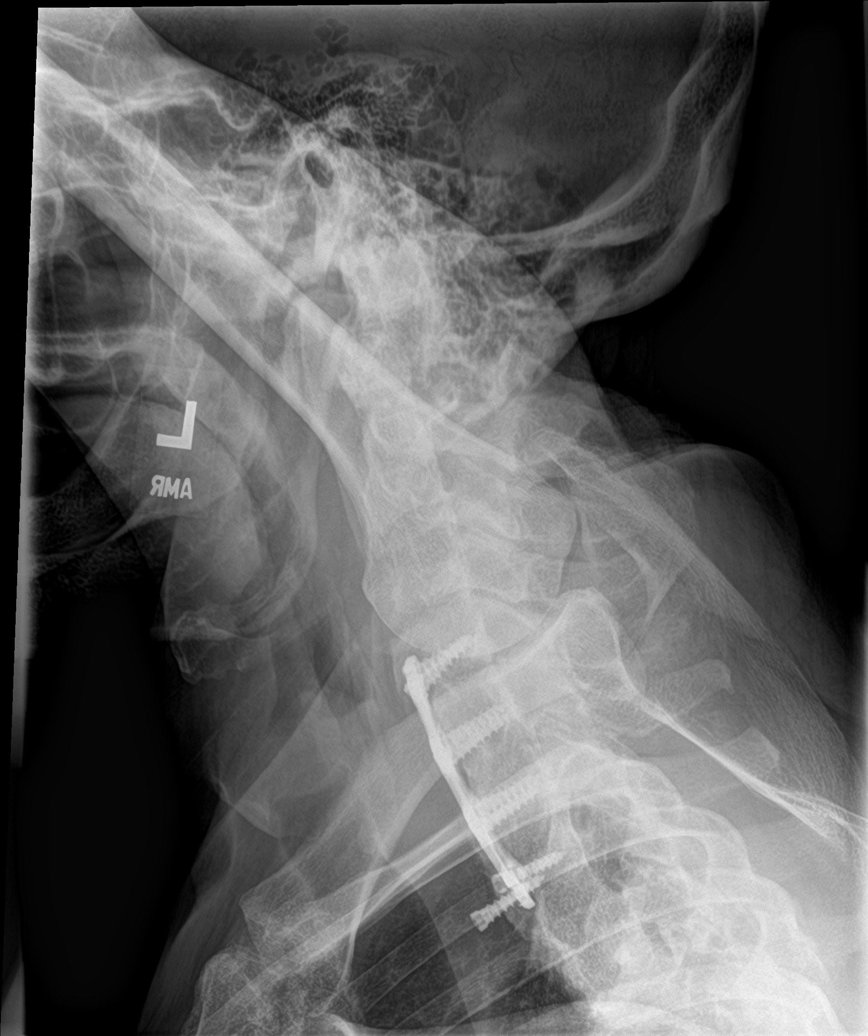

[6 of 6 positions shown; findings below may reference images not displayed]

FINDINGS: Frontal, lateral, open-mouth odontoid, and bilateral oblique views
were obtained. The patient is status post anterior screw and plate
fixation from C4-C7 with support hardware intact. There is no
fracture or spondylolisthesis. Prevertebral soft tissues and
predental space regions are normal. There is partial ankylosis at
C4-5, C5-6, and C6-7. Other disc spaces appear unremarkable. There
is no significant exit foraminal narrowing on the oblique views.
Lung apices are clear.
IMPRESSION: Postoperative anterior fusion from C4-C7 with support hardware
intact. No fracture or spondylolisthesis. Partial ankylosis at C4-5,
C5-6, and C6-7. No appreciable exit foraminal narrowing on the
oblique views.

## 2021-08-29 DIAGNOSIS — Z961 Presence of intraocular lens: Secondary | ICD-10-CM | POA: Diagnosis not present

## 2021-08-29 DIAGNOSIS — H353231 Exudative age-related macular degeneration, bilateral, with active choroidal neovascularization: Secondary | ICD-10-CM | POA: Diagnosis not present

## 2021-08-29 DIAGNOSIS — H43813 Vitreous degeneration, bilateral: Secondary | ICD-10-CM | POA: Diagnosis not present

## 2021-08-31 DIAGNOSIS — M48062 Spinal stenosis, lumbar region with neurogenic claudication: Secondary | ICD-10-CM | POA: Diagnosis not present

## 2021-08-31 DIAGNOSIS — Z299 Encounter for prophylactic measures, unspecified: Secondary | ICD-10-CM | POA: Diagnosis not present

## 2021-08-31 DIAGNOSIS — W19XXXA Unspecified fall, initial encounter: Secondary | ICD-10-CM | POA: Diagnosis not present

## 2021-08-31 DIAGNOSIS — I10 Essential (primary) hypertension: Secondary | ICD-10-CM | POA: Diagnosis not present

## 2021-08-31 DIAGNOSIS — Z0000 Encounter for general adult medical examination without abnormal findings: Secondary | ICD-10-CM | POA: Diagnosis not present

## 2021-08-31 DIAGNOSIS — F17210 Nicotine dependence, cigarettes, uncomplicated: Secondary | ICD-10-CM | POA: Diagnosis not present

## 2021-09-04 DIAGNOSIS — M419 Scoliosis, unspecified: Secondary | ICD-10-CM | POA: Diagnosis not present

## 2021-09-04 DIAGNOSIS — Q7649 Other congenital malformations of spine, not associated with scoliosis: Secondary | ICD-10-CM | POA: Diagnosis not present

## 2021-09-04 DIAGNOSIS — M545 Low back pain, unspecified: Secondary | ICD-10-CM | POA: Diagnosis not present

## 2021-09-04 DIAGNOSIS — M5136 Other intervertebral disc degeneration, lumbar region: Secondary | ICD-10-CM | POA: Diagnosis not present

## 2021-09-04 DIAGNOSIS — Z9181 History of falling: Secondary | ICD-10-CM | POA: Diagnosis not present

## 2021-09-26 DIAGNOSIS — Z961 Presence of intraocular lens: Secondary | ICD-10-CM | POA: Diagnosis not present

## 2021-09-26 DIAGNOSIS — H43813 Vitreous degeneration, bilateral: Secondary | ICD-10-CM | POA: Diagnosis not present

## 2021-09-26 DIAGNOSIS — H353231 Exudative age-related macular degeneration, bilateral, with active choroidal neovascularization: Secondary | ICD-10-CM | POA: Diagnosis not present

## 2021-09-28 DIAGNOSIS — M48062 Spinal stenosis, lumbar region with neurogenic claudication: Secondary | ICD-10-CM | POA: Diagnosis not present

## 2021-09-28 DIAGNOSIS — Z87891 Personal history of nicotine dependence: Secondary | ICD-10-CM | POA: Diagnosis not present

## 2021-09-28 DIAGNOSIS — I10 Essential (primary) hypertension: Secondary | ICD-10-CM | POA: Diagnosis not present

## 2021-09-28 DIAGNOSIS — Z299 Encounter for prophylactic measures, unspecified: Secondary | ICD-10-CM | POA: Diagnosis not present

## 2021-09-28 DIAGNOSIS — F17210 Nicotine dependence, cigarettes, uncomplicated: Secondary | ICD-10-CM | POA: Diagnosis not present

## 2021-10-15 DIAGNOSIS — M79674 Pain in right toe(s): Secondary | ICD-10-CM | POA: Diagnosis not present

## 2021-10-15 DIAGNOSIS — L110 Acquired keratosis follicularis: Secondary | ICD-10-CM | POA: Diagnosis not present

## 2021-10-15 DIAGNOSIS — I739 Peripheral vascular disease, unspecified: Secondary | ICD-10-CM | POA: Diagnosis not present

## 2021-10-15 DIAGNOSIS — M79671 Pain in right foot: Secondary | ICD-10-CM | POA: Diagnosis not present

## 2021-10-15 DIAGNOSIS — M79672 Pain in left foot: Secondary | ICD-10-CM | POA: Diagnosis not present

## 2021-10-15 DIAGNOSIS — M79675 Pain in left toe(s): Secondary | ICD-10-CM | POA: Diagnosis not present

## 2021-10-17 DIAGNOSIS — Z299 Encounter for prophylactic measures, unspecified: Secondary | ICD-10-CM | POA: Diagnosis not present

## 2021-10-17 DIAGNOSIS — M48062 Spinal stenosis, lumbar region with neurogenic claudication: Secondary | ICD-10-CM | POA: Diagnosis not present

## 2021-10-17 DIAGNOSIS — Z789 Other specified health status: Secondary | ICD-10-CM | POA: Diagnosis not present

## 2021-10-17 DIAGNOSIS — J449 Chronic obstructive pulmonary disease, unspecified: Secondary | ICD-10-CM | POA: Diagnosis not present

## 2021-10-17 DIAGNOSIS — F17210 Nicotine dependence, cigarettes, uncomplicated: Secondary | ICD-10-CM | POA: Diagnosis not present

## 2021-10-24 DIAGNOSIS — Z961 Presence of intraocular lens: Secondary | ICD-10-CM | POA: Diagnosis not present

## 2021-10-24 DIAGNOSIS — H43813 Vitreous degeneration, bilateral: Secondary | ICD-10-CM | POA: Diagnosis not present

## 2021-10-24 DIAGNOSIS — H353231 Exudative age-related macular degeneration, bilateral, with active choroidal neovascularization: Secondary | ICD-10-CM | POA: Diagnosis not present

## 2021-10-29 DIAGNOSIS — F17210 Nicotine dependence, cigarettes, uncomplicated: Secondary | ICD-10-CM | POA: Diagnosis not present

## 2021-10-29 DIAGNOSIS — I10 Essential (primary) hypertension: Secondary | ICD-10-CM | POA: Diagnosis not present

## 2021-10-29 DIAGNOSIS — Z299 Encounter for prophylactic measures, unspecified: Secondary | ICD-10-CM | POA: Diagnosis not present

## 2021-10-29 DIAGNOSIS — M48062 Spinal stenosis, lumbar region with neurogenic claudication: Secondary | ICD-10-CM | POA: Diagnosis not present

## 2021-11-21 DIAGNOSIS — Z961 Presence of intraocular lens: Secondary | ICD-10-CM | POA: Diagnosis not present

## 2021-11-21 DIAGNOSIS — H353231 Exudative age-related macular degeneration, bilateral, with active choroidal neovascularization: Secondary | ICD-10-CM | POA: Diagnosis not present

## 2021-11-21 DIAGNOSIS — H43813 Vitreous degeneration, bilateral: Secondary | ICD-10-CM | POA: Diagnosis not present

## 2021-11-28 DIAGNOSIS — Z299 Encounter for prophylactic measures, unspecified: Secondary | ICD-10-CM | POA: Diagnosis not present

## 2021-11-28 DIAGNOSIS — I10 Essential (primary) hypertension: Secondary | ICD-10-CM | POA: Diagnosis not present

## 2021-11-28 DIAGNOSIS — M48062 Spinal stenosis, lumbar region with neurogenic claudication: Secondary | ICD-10-CM | POA: Diagnosis not present

## 2021-11-28 DIAGNOSIS — F17210 Nicotine dependence, cigarettes, uncomplicated: Secondary | ICD-10-CM | POA: Diagnosis not present

## 2021-12-19 DIAGNOSIS — H43813 Vitreous degeneration, bilateral: Secondary | ICD-10-CM | POA: Diagnosis not present

## 2021-12-19 DIAGNOSIS — H353231 Exudative age-related macular degeneration, bilateral, with active choroidal neovascularization: Secondary | ICD-10-CM | POA: Diagnosis not present

## 2021-12-19 DIAGNOSIS — Z961 Presence of intraocular lens: Secondary | ICD-10-CM | POA: Diagnosis not present

## 2021-12-24 DIAGNOSIS — L110 Acquired keratosis follicularis: Secondary | ICD-10-CM | POA: Diagnosis not present

## 2021-12-24 DIAGNOSIS — M79674 Pain in right toe(s): Secondary | ICD-10-CM | POA: Diagnosis not present

## 2021-12-24 DIAGNOSIS — M79675 Pain in left toe(s): Secondary | ICD-10-CM | POA: Diagnosis not present

## 2021-12-24 DIAGNOSIS — I739 Peripheral vascular disease, unspecified: Secondary | ICD-10-CM | POA: Diagnosis not present

## 2021-12-24 DIAGNOSIS — M79672 Pain in left foot: Secondary | ICD-10-CM | POA: Diagnosis not present

## 2021-12-24 DIAGNOSIS — M79671 Pain in right foot: Secondary | ICD-10-CM | POA: Diagnosis not present

## 2021-12-28 DIAGNOSIS — G8929 Other chronic pain: Secondary | ICD-10-CM | POA: Diagnosis not present

## 2021-12-28 DIAGNOSIS — Z299 Encounter for prophylactic measures, unspecified: Secondary | ICD-10-CM | POA: Diagnosis not present

## 2021-12-28 DIAGNOSIS — Z79899 Other long term (current) drug therapy: Secondary | ICD-10-CM | POA: Diagnosis not present

## 2021-12-28 DIAGNOSIS — F17210 Nicotine dependence, cigarettes, uncomplicated: Secondary | ICD-10-CM | POA: Diagnosis not present

## 2021-12-28 DIAGNOSIS — I10 Essential (primary) hypertension: Secondary | ICD-10-CM | POA: Diagnosis not present

## 2021-12-28 DIAGNOSIS — M549 Dorsalgia, unspecified: Secondary | ICD-10-CM | POA: Diagnosis not present

## 2022-01-18 DIAGNOSIS — H353231 Exudative age-related macular degeneration, bilateral, with active choroidal neovascularization: Secondary | ICD-10-CM | POA: Diagnosis not present

## 2022-01-18 DIAGNOSIS — H43813 Vitreous degeneration, bilateral: Secondary | ICD-10-CM | POA: Diagnosis not present

## 2022-01-18 DIAGNOSIS — Z961 Presence of intraocular lens: Secondary | ICD-10-CM | POA: Diagnosis not present

## 2022-01-28 DIAGNOSIS — M549 Dorsalgia, unspecified: Secondary | ICD-10-CM | POA: Diagnosis not present

## 2022-01-28 DIAGNOSIS — I10 Essential (primary) hypertension: Secondary | ICD-10-CM | POA: Diagnosis not present

## 2022-01-28 DIAGNOSIS — Z79899 Other long term (current) drug therapy: Secondary | ICD-10-CM | POA: Diagnosis not present

## 2022-01-28 DIAGNOSIS — G8929 Other chronic pain: Secondary | ICD-10-CM | POA: Diagnosis not present

## 2022-01-28 DIAGNOSIS — Z299 Encounter for prophylactic measures, unspecified: Secondary | ICD-10-CM | POA: Diagnosis not present

## 2022-01-28 DIAGNOSIS — J439 Emphysema, unspecified: Secondary | ICD-10-CM | POA: Diagnosis not present

## 2022-02-27 DIAGNOSIS — M549 Dorsalgia, unspecified: Secondary | ICD-10-CM | POA: Diagnosis not present

## 2022-02-27 DIAGNOSIS — I10 Essential (primary) hypertension: Secondary | ICD-10-CM | POA: Diagnosis not present

## 2022-02-27 DIAGNOSIS — F17210 Nicotine dependence, cigarettes, uncomplicated: Secondary | ICD-10-CM | POA: Diagnosis not present

## 2022-02-27 DIAGNOSIS — G8929 Other chronic pain: Secondary | ICD-10-CM | POA: Diagnosis not present

## 2022-02-27 DIAGNOSIS — Z79899 Other long term (current) drug therapy: Secondary | ICD-10-CM | POA: Diagnosis not present

## 2022-02-27 DIAGNOSIS — J441 Chronic obstructive pulmonary disease with (acute) exacerbation: Secondary | ICD-10-CM | POA: Diagnosis not present

## 2022-02-27 DIAGNOSIS — Z299 Encounter for prophylactic measures, unspecified: Secondary | ICD-10-CM | POA: Diagnosis not present

## 2022-03-06 DIAGNOSIS — H43813 Vitreous degeneration, bilateral: Secondary | ICD-10-CM | POA: Diagnosis not present

## 2022-03-06 DIAGNOSIS — H353231 Exudative age-related macular degeneration, bilateral, with active choroidal neovascularization: Secondary | ICD-10-CM | POA: Diagnosis not present

## 2022-03-06 DIAGNOSIS — Z961 Presence of intraocular lens: Secondary | ICD-10-CM | POA: Diagnosis not present

## 2022-03-11 DIAGNOSIS — M79675 Pain in left toe(s): Secondary | ICD-10-CM | POA: Diagnosis not present

## 2022-03-11 DIAGNOSIS — I739 Peripheral vascular disease, unspecified: Secondary | ICD-10-CM | POA: Diagnosis not present

## 2022-03-11 DIAGNOSIS — L110 Acquired keratosis follicularis: Secondary | ICD-10-CM | POA: Diagnosis not present

## 2022-03-11 DIAGNOSIS — M79671 Pain in right foot: Secondary | ICD-10-CM | POA: Diagnosis not present

## 2022-03-11 DIAGNOSIS — M79674 Pain in right toe(s): Secondary | ICD-10-CM | POA: Diagnosis not present

## 2022-03-11 DIAGNOSIS — M79672 Pain in left foot: Secondary | ICD-10-CM | POA: Diagnosis not present

## 2022-03-29 DIAGNOSIS — G8929 Other chronic pain: Secondary | ICD-10-CM | POA: Diagnosis not present

## 2022-03-29 DIAGNOSIS — I10 Essential (primary) hypertension: Secondary | ICD-10-CM | POA: Diagnosis not present

## 2022-03-29 DIAGNOSIS — Z299 Encounter for prophylactic measures, unspecified: Secondary | ICD-10-CM | POA: Diagnosis not present

## 2022-03-29 DIAGNOSIS — M549 Dorsalgia, unspecified: Secondary | ICD-10-CM | POA: Diagnosis not present

## 2022-04-10 DIAGNOSIS — L908 Other atrophic disorders of skin: Secondary | ICD-10-CM | POA: Diagnosis not present

## 2022-04-10 DIAGNOSIS — H353231 Exudative age-related macular degeneration, bilateral, with active choroidal neovascularization: Secondary | ICD-10-CM | POA: Diagnosis not present

## 2022-04-10 DIAGNOSIS — H43813 Vitreous degeneration, bilateral: Secondary | ICD-10-CM | POA: Diagnosis not present

## 2022-04-10 DIAGNOSIS — Z961 Presence of intraocular lens: Secondary | ICD-10-CM | POA: Diagnosis not present

## 2022-04-10 DIAGNOSIS — H35363 Drusen (degenerative) of macula, bilateral: Secondary | ICD-10-CM | POA: Diagnosis not present

## 2022-04-29 DIAGNOSIS — Z79899 Other long term (current) drug therapy: Secondary | ICD-10-CM | POA: Diagnosis not present

## 2022-04-29 DIAGNOSIS — M549 Dorsalgia, unspecified: Secondary | ICD-10-CM | POA: Diagnosis not present

## 2022-04-29 DIAGNOSIS — G8929 Other chronic pain: Secondary | ICD-10-CM | POA: Diagnosis not present

## 2022-04-29 DIAGNOSIS — M1711 Unilateral primary osteoarthritis, right knee: Secondary | ICD-10-CM | POA: Diagnosis not present

## 2022-04-29 DIAGNOSIS — J449 Chronic obstructive pulmonary disease, unspecified: Secondary | ICD-10-CM | POA: Diagnosis not present

## 2022-04-29 DIAGNOSIS — I10 Essential (primary) hypertension: Secondary | ICD-10-CM | POA: Diagnosis not present

## 2022-04-29 DIAGNOSIS — Z299 Encounter for prophylactic measures, unspecified: Secondary | ICD-10-CM | POA: Diagnosis not present

## 2022-05-01 DIAGNOSIS — I10 Essential (primary) hypertension: Secondary | ICD-10-CM | POA: Diagnosis not present

## 2022-05-01 DIAGNOSIS — E781 Pure hyperglyceridemia: Secondary | ICD-10-CM | POA: Diagnosis not present

## 2022-05-06 DIAGNOSIS — J449 Chronic obstructive pulmonary disease, unspecified: Secondary | ICD-10-CM | POA: Diagnosis not present

## 2022-05-06 DIAGNOSIS — Z299 Encounter for prophylactic measures, unspecified: Secondary | ICD-10-CM | POA: Diagnosis not present

## 2022-05-06 DIAGNOSIS — I10 Essential (primary) hypertension: Secondary | ICD-10-CM | POA: Diagnosis not present

## 2022-05-21 DIAGNOSIS — R0602 Shortness of breath: Secondary | ICD-10-CM | POA: Diagnosis not present

## 2022-05-21 DIAGNOSIS — J441 Chronic obstructive pulmonary disease with (acute) exacerbation: Secondary | ICD-10-CM | POA: Diagnosis not present

## 2022-05-21 DIAGNOSIS — I2510 Atherosclerotic heart disease of native coronary artery without angina pectoris: Secondary | ICD-10-CM | POA: Diagnosis not present

## 2022-05-21 DIAGNOSIS — Z720 Tobacco use: Secondary | ICD-10-CM | POA: Diagnosis not present

## 2022-05-22 DIAGNOSIS — I2510 Atherosclerotic heart disease of native coronary artery without angina pectoris: Secondary | ICD-10-CM | POA: Diagnosis not present

## 2022-05-23 DIAGNOSIS — L110 Acquired keratosis follicularis: Secondary | ICD-10-CM | POA: Diagnosis not present

## 2022-05-23 DIAGNOSIS — M79671 Pain in right foot: Secondary | ICD-10-CM | POA: Diagnosis not present

## 2022-05-23 DIAGNOSIS — I739 Peripheral vascular disease, unspecified: Secondary | ICD-10-CM | POA: Diagnosis not present

## 2022-05-23 DIAGNOSIS — M79672 Pain in left foot: Secondary | ICD-10-CM | POA: Diagnosis not present

## 2022-05-23 DIAGNOSIS — M79675 Pain in left toe(s): Secondary | ICD-10-CM | POA: Diagnosis not present

## 2022-05-23 DIAGNOSIS — M79674 Pain in right toe(s): Secondary | ICD-10-CM | POA: Diagnosis not present

## 2022-05-30 DIAGNOSIS — M549 Dorsalgia, unspecified: Secondary | ICD-10-CM | POA: Diagnosis not present

## 2022-05-30 DIAGNOSIS — G8929 Other chronic pain: Secondary | ICD-10-CM | POA: Diagnosis not present

## 2022-05-30 DIAGNOSIS — E876 Hypokalemia: Secondary | ICD-10-CM | POA: Diagnosis not present

## 2022-05-30 DIAGNOSIS — I10 Essential (primary) hypertension: Secondary | ICD-10-CM | POA: Diagnosis not present

## 2022-05-30 DIAGNOSIS — Z299 Encounter for prophylactic measures, unspecified: Secondary | ICD-10-CM | POA: Diagnosis not present

## 2022-06-19 DIAGNOSIS — M25511 Pain in right shoulder: Secondary | ICD-10-CM | POA: Diagnosis not present

## 2022-06-19 DIAGNOSIS — I10 Essential (primary) hypertension: Secondary | ICD-10-CM | POA: Diagnosis not present

## 2022-06-19 DIAGNOSIS — B370 Candidal stomatitis: Secondary | ICD-10-CM | POA: Diagnosis not present

## 2022-06-19 DIAGNOSIS — Z299 Encounter for prophylactic measures, unspecified: Secondary | ICD-10-CM | POA: Diagnosis not present

## 2022-06-22 DIAGNOSIS — J449 Chronic obstructive pulmonary disease, unspecified: Secondary | ICD-10-CM | POA: Diagnosis not present

## 2022-06-22 DIAGNOSIS — S46211A Strain of muscle, fascia and tendon of other parts of biceps, right arm, initial encounter: Secondary | ICD-10-CM | POA: Diagnosis not present

## 2022-06-22 DIAGNOSIS — I10 Essential (primary) hypertension: Secondary | ICD-10-CM | POA: Diagnosis not present

## 2022-06-22 DIAGNOSIS — M24411 Recurrent dislocation, right shoulder: Secondary | ICD-10-CM | POA: Diagnosis not present

## 2022-06-22 DIAGNOSIS — M66821 Spontaneous rupture of other tendons, right upper arm: Secondary | ICD-10-CM | POA: Diagnosis not present

## 2022-06-22 DIAGNOSIS — X501XXA Overexertion from prolonged static or awkward postures, initial encounter: Secondary | ICD-10-CM | POA: Diagnosis not present

## 2022-06-22 DIAGNOSIS — M25511 Pain in right shoulder: Secondary | ICD-10-CM | POA: Diagnosis not present

## 2022-06-23 DIAGNOSIS — G8929 Other chronic pain: Secondary | ICD-10-CM | POA: Diagnosis not present

## 2022-06-23 DIAGNOSIS — Z87891 Personal history of nicotine dependence: Secondary | ICD-10-CM | POA: Diagnosis not present

## 2022-06-23 DIAGNOSIS — M79641 Pain in right hand: Secondary | ICD-10-CM | POA: Diagnosis not present

## 2022-06-23 DIAGNOSIS — M25511 Pain in right shoulder: Secondary | ICD-10-CM | POA: Diagnosis not present

## 2022-06-23 DIAGNOSIS — J449 Chronic obstructive pulmonary disease, unspecified: Secondary | ICD-10-CM | POA: Diagnosis not present

## 2022-06-23 DIAGNOSIS — I10 Essential (primary) hypertension: Secondary | ICD-10-CM | POA: Diagnosis not present

## 2022-06-24 DIAGNOSIS — I10 Essential (primary) hypertension: Secondary | ICD-10-CM | POA: Diagnosis not present

## 2022-06-24 DIAGNOSIS — M549 Dorsalgia, unspecified: Secondary | ICD-10-CM | POA: Diagnosis not present

## 2022-06-24 DIAGNOSIS — S46211S Strain of muscle, fascia and tendon of other parts of biceps, right arm, sequela: Secondary | ICD-10-CM | POA: Diagnosis not present

## 2022-06-24 DIAGNOSIS — Z299 Encounter for prophylactic measures, unspecified: Secondary | ICD-10-CM | POA: Diagnosis not present

## 2022-06-24 DIAGNOSIS — M79641 Pain in right hand: Secondary | ICD-10-CM | POA: Diagnosis not present

## 2022-06-24 DIAGNOSIS — G8929 Other chronic pain: Secondary | ICD-10-CM | POA: Diagnosis not present

## 2022-07-02 DIAGNOSIS — H3530 Unspecified macular degeneration: Secondary | ICD-10-CM | POA: Diagnosis not present

## 2022-07-02 DIAGNOSIS — Z792 Long term (current) use of antibiotics: Secondary | ICD-10-CM | POA: Diagnosis not present

## 2022-07-02 DIAGNOSIS — M6289 Other specified disorders of muscle: Secondary | ICD-10-CM | POA: Diagnosis not present

## 2022-07-02 DIAGNOSIS — L02211 Cutaneous abscess of abdominal wall: Secondary | ICD-10-CM | POA: Diagnosis not present

## 2022-07-02 DIAGNOSIS — E119 Type 2 diabetes mellitus without complications: Secondary | ICD-10-CM | POA: Diagnosis not present

## 2022-07-02 DIAGNOSIS — J449 Chronic obstructive pulmonary disease, unspecified: Secondary | ICD-10-CM | POA: Diagnosis not present

## 2022-07-02 DIAGNOSIS — I2510 Atherosclerotic heart disease of native coronary artery without angina pectoris: Secondary | ICD-10-CM | POA: Diagnosis not present

## 2022-07-02 DIAGNOSIS — R509 Fever, unspecified: Secondary | ICD-10-CM | POA: Diagnosis not present

## 2022-07-02 DIAGNOSIS — E1165 Type 2 diabetes mellitus with hyperglycemia: Secondary | ICD-10-CM | POA: Diagnosis not present

## 2022-07-02 DIAGNOSIS — J432 Centrilobular emphysema: Secondary | ICD-10-CM | POA: Diagnosis not present

## 2022-07-02 DIAGNOSIS — Z87891 Personal history of nicotine dependence: Secondary | ICD-10-CM | POA: Diagnosis not present

## 2022-07-02 DIAGNOSIS — L0390 Cellulitis, unspecified: Secondary | ICD-10-CM | POA: Diagnosis not present

## 2022-07-02 DIAGNOSIS — R531 Weakness: Secondary | ICD-10-CM | POA: Diagnosis not present

## 2022-07-02 DIAGNOSIS — I888 Other nonspecific lymphadenitis: Secondary | ICD-10-CM | POA: Diagnosis not present

## 2022-07-02 DIAGNOSIS — L0291 Cutaneous abscess, unspecified: Secondary | ICD-10-CM | POA: Diagnosis not present

## 2022-07-02 DIAGNOSIS — Z79899 Other long term (current) drug therapy: Secondary | ICD-10-CM | POA: Diagnosis not present

## 2022-07-02 DIAGNOSIS — B9562 Methicillin resistant Staphylococcus aureus infection as the cause of diseases classified elsewhere: Secondary | ICD-10-CM | POA: Diagnosis not present

## 2022-07-02 DIAGNOSIS — L02818 Cutaneous abscess of other sites: Secondary | ICD-10-CM | POA: Diagnosis not present

## 2022-07-02 DIAGNOSIS — L02214 Cutaneous abscess of groin: Secondary | ICD-10-CM | POA: Diagnosis not present

## 2022-07-02 DIAGNOSIS — Z7951 Long term (current) use of inhaled steroids: Secondary | ICD-10-CM | POA: Diagnosis not present

## 2022-07-02 DIAGNOSIS — Z7984 Long term (current) use of oral hypoglycemic drugs: Secondary | ICD-10-CM | POA: Diagnosis not present

## 2022-07-02 DIAGNOSIS — L02415 Cutaneous abscess of right lower limb: Secondary | ICD-10-CM | POA: Diagnosis not present

## 2022-07-02 DIAGNOSIS — Z743 Need for continuous supervision: Secondary | ICD-10-CM | POA: Diagnosis not present

## 2022-07-02 DIAGNOSIS — R739 Hyperglycemia, unspecified: Secondary | ICD-10-CM | POA: Diagnosis not present

## 2022-07-02 DIAGNOSIS — I10 Essential (primary) hypertension: Secondary | ICD-10-CM | POA: Diagnosis not present

## 2022-07-02 DIAGNOSIS — R000 Tachycardia, unspecified: Secondary | ICD-10-CM | POA: Diagnosis not present

## 2022-07-02 DIAGNOSIS — E876 Hypokalemia: Secondary | ICD-10-CM | POA: Diagnosis not present

## 2022-07-02 DIAGNOSIS — L03314 Cellulitis of groin: Secondary | ICD-10-CM | POA: Diagnosis not present

## 2022-07-10 DIAGNOSIS — I10 Essential (primary) hypertension: Secondary | ICD-10-CM | POA: Diagnosis not present

## 2022-07-10 DIAGNOSIS — Z743 Need for continuous supervision: Secondary | ICD-10-CM | POA: Diagnosis not present

## 2022-07-10 DIAGNOSIS — R000 Tachycardia, unspecified: Secondary | ICD-10-CM | POA: Diagnosis not present

## 2022-07-10 DIAGNOSIS — L02214 Cutaneous abscess of groin: Secondary | ICD-10-CM | POA: Diagnosis not present

## 2022-07-10 DIAGNOSIS — M549 Dorsalgia, unspecified: Secondary | ICD-10-CM | POA: Diagnosis not present

## 2022-07-10 DIAGNOSIS — L089 Local infection of the skin and subcutaneous tissue, unspecified: Secondary | ICD-10-CM | POA: Diagnosis not present

## 2022-07-10 DIAGNOSIS — Z87891 Personal history of nicotine dependence: Secondary | ICD-10-CM | POA: Diagnosis not present

## 2022-07-10 DIAGNOSIS — L03818 Cellulitis of other sites: Secondary | ICD-10-CM | POA: Diagnosis not present

## 2022-07-10 DIAGNOSIS — L0211 Cutaneous abscess of neck: Secondary | ICD-10-CM | POA: Diagnosis not present

## 2022-07-10 DIAGNOSIS — J449 Chronic obstructive pulmonary disease, unspecified: Secondary | ICD-10-CM | POA: Diagnosis not present

## 2022-07-11 DIAGNOSIS — I10 Essential (primary) hypertension: Secondary | ICD-10-CM | POA: Diagnosis not present

## 2022-07-11 DIAGNOSIS — L0390 Cellulitis, unspecified: Secondary | ICD-10-CM | POA: Diagnosis not present

## 2022-07-11 DIAGNOSIS — Z299 Encounter for prophylactic measures, unspecified: Secondary | ICD-10-CM | POA: Diagnosis not present

## 2022-07-15 DIAGNOSIS — L0291 Cutaneous abscess, unspecified: Secondary | ICD-10-CM | POA: Diagnosis not present

## 2022-07-17 DIAGNOSIS — L0291 Cutaneous abscess, unspecified: Secondary | ICD-10-CM | POA: Diagnosis not present

## 2022-07-19 DIAGNOSIS — Z79899 Other long term (current) drug therapy: Secondary | ICD-10-CM | POA: Diagnosis not present

## 2022-07-19 DIAGNOSIS — E119 Type 2 diabetes mellitus without complications: Secondary | ICD-10-CM | POA: Diagnosis not present

## 2022-07-19 DIAGNOSIS — L0291 Cutaneous abscess, unspecified: Secondary | ICD-10-CM | POA: Diagnosis not present

## 2022-07-19 DIAGNOSIS — I10 Essential (primary) hypertension: Secondary | ICD-10-CM | POA: Diagnosis not present

## 2022-07-22 DIAGNOSIS — E119 Type 2 diabetes mellitus without complications: Secondary | ICD-10-CM | POA: Diagnosis not present

## 2022-07-22 DIAGNOSIS — I10 Essential (primary) hypertension: Secondary | ICD-10-CM | POA: Diagnosis not present

## 2022-07-22 DIAGNOSIS — Z79899 Other long term (current) drug therapy: Secondary | ICD-10-CM | POA: Diagnosis not present

## 2022-07-22 DIAGNOSIS — L0291 Cutaneous abscess, unspecified: Secondary | ICD-10-CM | POA: Diagnosis not present

## 2022-07-24 DIAGNOSIS — I10 Essential (primary) hypertension: Secondary | ICD-10-CM | POA: Diagnosis not present

## 2022-07-24 DIAGNOSIS — E119 Type 2 diabetes mellitus without complications: Secondary | ICD-10-CM | POA: Diagnosis not present

## 2022-07-24 DIAGNOSIS — Z79899 Other long term (current) drug therapy: Secondary | ICD-10-CM | POA: Diagnosis not present

## 2022-07-24 DIAGNOSIS — L0291 Cutaneous abscess, unspecified: Secondary | ICD-10-CM | POA: Diagnosis not present

## 2022-07-25 DIAGNOSIS — Z299 Encounter for prophylactic measures, unspecified: Secondary | ICD-10-CM | POA: Diagnosis not present

## 2022-07-25 DIAGNOSIS — E1165 Type 2 diabetes mellitus with hyperglycemia: Secondary | ICD-10-CM | POA: Diagnosis not present

## 2022-07-25 DIAGNOSIS — M48062 Spinal stenosis, lumbar region with neurogenic claudication: Secondary | ICD-10-CM | POA: Diagnosis not present

## 2022-07-25 DIAGNOSIS — I10 Essential (primary) hypertension: Secondary | ICD-10-CM | POA: Diagnosis not present

## 2022-07-26 DIAGNOSIS — I10 Essential (primary) hypertension: Secondary | ICD-10-CM | POA: Diagnosis not present

## 2022-07-26 DIAGNOSIS — E119 Type 2 diabetes mellitus without complications: Secondary | ICD-10-CM | POA: Diagnosis not present

## 2022-07-26 DIAGNOSIS — S46211A Strain of muscle, fascia and tendon of other parts of biceps, right arm, initial encounter: Secondary | ICD-10-CM | POA: Diagnosis not present

## 2022-07-26 DIAGNOSIS — L0291 Cutaneous abscess, unspecified: Secondary | ICD-10-CM | POA: Diagnosis not present

## 2022-07-26 DIAGNOSIS — Z79899 Other long term (current) drug therapy: Secondary | ICD-10-CM | POA: Diagnosis not present

## 2022-07-29 DIAGNOSIS — L0291 Cutaneous abscess, unspecified: Secondary | ICD-10-CM | POA: Diagnosis not present

## 2022-07-29 DIAGNOSIS — I10 Essential (primary) hypertension: Secondary | ICD-10-CM | POA: Diagnosis not present

## 2022-07-29 DIAGNOSIS — E119 Type 2 diabetes mellitus without complications: Secondary | ICD-10-CM | POA: Diagnosis not present

## 2022-07-29 DIAGNOSIS — Z79899 Other long term (current) drug therapy: Secondary | ICD-10-CM | POA: Diagnosis not present

## 2022-07-31 DIAGNOSIS — I10 Essential (primary) hypertension: Secondary | ICD-10-CM | POA: Diagnosis not present

## 2022-07-31 DIAGNOSIS — E119 Type 2 diabetes mellitus without complications: Secondary | ICD-10-CM | POA: Diagnosis not present

## 2022-07-31 DIAGNOSIS — Z79899 Other long term (current) drug therapy: Secondary | ICD-10-CM | POA: Diagnosis not present

## 2022-07-31 DIAGNOSIS — L0291 Cutaneous abscess, unspecified: Secondary | ICD-10-CM | POA: Diagnosis not present

## 2022-08-02 DIAGNOSIS — Z79899 Other long term (current) drug therapy: Secondary | ICD-10-CM | POA: Diagnosis not present

## 2022-08-02 DIAGNOSIS — I10 Essential (primary) hypertension: Secondary | ICD-10-CM | POA: Diagnosis not present

## 2022-08-02 DIAGNOSIS — E119 Type 2 diabetes mellitus without complications: Secondary | ICD-10-CM | POA: Diagnosis not present

## 2022-08-02 DIAGNOSIS — L0291 Cutaneous abscess, unspecified: Secondary | ICD-10-CM | POA: Diagnosis not present

## 2022-08-03 DIAGNOSIS — L0291 Cutaneous abscess, unspecified: Secondary | ICD-10-CM | POA: Diagnosis not present

## 2022-08-03 DIAGNOSIS — Z4800 Encounter for change or removal of nonsurgical wound dressing: Secondary | ICD-10-CM | POA: Diagnosis not present

## 2022-08-04 DIAGNOSIS — L0291 Cutaneous abscess, unspecified: Secondary | ICD-10-CM | POA: Diagnosis not present

## 2022-08-04 DIAGNOSIS — Z4800 Encounter for change or removal of nonsurgical wound dressing: Secondary | ICD-10-CM | POA: Diagnosis not present

## 2022-08-05 DIAGNOSIS — E119 Type 2 diabetes mellitus without complications: Secondary | ICD-10-CM | POA: Diagnosis not present

## 2022-08-05 DIAGNOSIS — I10 Essential (primary) hypertension: Secondary | ICD-10-CM | POA: Diagnosis not present

## 2022-08-05 DIAGNOSIS — Z79899 Other long term (current) drug therapy: Secondary | ICD-10-CM | POA: Diagnosis not present

## 2022-08-05 DIAGNOSIS — L0291 Cutaneous abscess, unspecified: Secondary | ICD-10-CM | POA: Diagnosis not present

## 2022-08-06 DIAGNOSIS — E119 Type 2 diabetes mellitus without complications: Secondary | ICD-10-CM | POA: Diagnosis not present

## 2022-08-06 DIAGNOSIS — Z79899 Other long term (current) drug therapy: Secondary | ICD-10-CM | POA: Diagnosis not present

## 2022-08-06 DIAGNOSIS — L0291 Cutaneous abscess, unspecified: Secondary | ICD-10-CM | POA: Diagnosis not present

## 2022-08-06 DIAGNOSIS — I10 Essential (primary) hypertension: Secondary | ICD-10-CM | POA: Diagnosis not present

## 2022-08-07 DIAGNOSIS — Z79899 Other long term (current) drug therapy: Secondary | ICD-10-CM | POA: Diagnosis not present

## 2022-08-07 DIAGNOSIS — E119 Type 2 diabetes mellitus without complications: Secondary | ICD-10-CM | POA: Diagnosis not present

## 2022-08-07 DIAGNOSIS — L0291 Cutaneous abscess, unspecified: Secondary | ICD-10-CM | POA: Diagnosis not present

## 2022-08-07 DIAGNOSIS — I10 Essential (primary) hypertension: Secondary | ICD-10-CM | POA: Diagnosis not present

## 2022-08-08 DIAGNOSIS — Z299 Encounter for prophylactic measures, unspecified: Secondary | ICD-10-CM | POA: Diagnosis not present

## 2022-08-08 DIAGNOSIS — L0291 Cutaneous abscess, unspecified: Secondary | ICD-10-CM | POA: Diagnosis not present

## 2022-08-08 DIAGNOSIS — E119 Type 2 diabetes mellitus without complications: Secondary | ICD-10-CM | POA: Diagnosis not present

## 2022-08-08 DIAGNOSIS — I10 Essential (primary) hypertension: Secondary | ICD-10-CM | POA: Diagnosis not present

## 2022-08-08 DIAGNOSIS — M6281 Muscle weakness (generalized): Secondary | ICD-10-CM | POA: Diagnosis not present

## 2022-08-08 DIAGNOSIS — Z79899 Other long term (current) drug therapy: Secondary | ICD-10-CM | POA: Diagnosis not present

## 2022-08-08 DIAGNOSIS — E1165 Type 2 diabetes mellitus with hyperglycemia: Secondary | ICD-10-CM | POA: Diagnosis not present

## 2022-08-09 DIAGNOSIS — E119 Type 2 diabetes mellitus without complications: Secondary | ICD-10-CM | POA: Diagnosis not present

## 2022-08-09 DIAGNOSIS — Z79899 Other long term (current) drug therapy: Secondary | ICD-10-CM | POA: Diagnosis not present

## 2022-08-09 DIAGNOSIS — L0291 Cutaneous abscess, unspecified: Secondary | ICD-10-CM | POA: Diagnosis not present

## 2022-08-09 DIAGNOSIS — I10 Essential (primary) hypertension: Secondary | ICD-10-CM | POA: Diagnosis not present

## 2022-08-10 DIAGNOSIS — Z4800 Encounter for change or removal of nonsurgical wound dressing: Secondary | ICD-10-CM | POA: Diagnosis not present

## 2022-08-10 DIAGNOSIS — L0291 Cutaneous abscess, unspecified: Secondary | ICD-10-CM | POA: Diagnosis not present

## 2022-08-11 DIAGNOSIS — L0291 Cutaneous abscess, unspecified: Secondary | ICD-10-CM | POA: Diagnosis not present

## 2022-08-11 DIAGNOSIS — Z4800 Encounter for change or removal of nonsurgical wound dressing: Secondary | ICD-10-CM | POA: Diagnosis not present

## 2022-08-12 DIAGNOSIS — E119 Type 2 diabetes mellitus without complications: Secondary | ICD-10-CM | POA: Diagnosis not present

## 2022-08-12 DIAGNOSIS — I10 Essential (primary) hypertension: Secondary | ICD-10-CM | POA: Diagnosis not present

## 2022-08-12 DIAGNOSIS — L0291 Cutaneous abscess, unspecified: Secondary | ICD-10-CM | POA: Diagnosis not present

## 2022-08-12 DIAGNOSIS — Z79899 Other long term (current) drug therapy: Secondary | ICD-10-CM | POA: Diagnosis not present

## 2022-08-15 DIAGNOSIS — L0291 Cutaneous abscess, unspecified: Secondary | ICD-10-CM | POA: Diagnosis not present

## 2022-08-15 DIAGNOSIS — E119 Type 2 diabetes mellitus without complications: Secondary | ICD-10-CM | POA: Diagnosis not present

## 2022-08-15 DIAGNOSIS — I10 Essential (primary) hypertension: Secondary | ICD-10-CM | POA: Diagnosis not present

## 2022-08-15 DIAGNOSIS — Z79899 Other long term (current) drug therapy: Secondary | ICD-10-CM | POA: Diagnosis not present

## 2022-08-16 DIAGNOSIS — I10 Essential (primary) hypertension: Secondary | ICD-10-CM | POA: Diagnosis not present

## 2022-08-16 DIAGNOSIS — E119 Type 2 diabetes mellitus without complications: Secondary | ICD-10-CM | POA: Diagnosis not present

## 2022-08-16 DIAGNOSIS — L0291 Cutaneous abscess, unspecified: Secondary | ICD-10-CM | POA: Diagnosis not present

## 2022-08-16 DIAGNOSIS — Z79899 Other long term (current) drug therapy: Secondary | ICD-10-CM | POA: Diagnosis not present

## 2022-08-18 DIAGNOSIS — Z4800 Encounter for change or removal of nonsurgical wound dressing: Secondary | ICD-10-CM | POA: Diagnosis not present

## 2022-08-18 DIAGNOSIS — L0291 Cutaneous abscess, unspecified: Secondary | ICD-10-CM | POA: Diagnosis not present

## 2022-08-19 DIAGNOSIS — F17290 Nicotine dependence, other tobacco product, uncomplicated: Secondary | ICD-10-CM | POA: Diagnosis not present

## 2022-08-19 DIAGNOSIS — L732 Hidradenitis suppurativa: Secondary | ICD-10-CM | POA: Diagnosis not present

## 2022-08-19 DIAGNOSIS — Z299 Encounter for prophylactic measures, unspecified: Secondary | ICD-10-CM | POA: Diagnosis not present

## 2022-08-19 DIAGNOSIS — L02214 Cutaneous abscess of groin: Secondary | ICD-10-CM | POA: Diagnosis not present

## 2022-08-19 DIAGNOSIS — L03314 Cellulitis of groin: Secondary | ICD-10-CM | POA: Diagnosis not present

## 2022-08-19 DIAGNOSIS — Z7984 Long term (current) use of oral hypoglycemic drugs: Secondary | ICD-10-CM | POA: Diagnosis not present

## 2022-08-19 DIAGNOSIS — I10 Essential (primary) hypertension: Secondary | ICD-10-CM | POA: Diagnosis not present

## 2022-08-19 DIAGNOSIS — L03115 Cellulitis of right lower limb: Secondary | ICD-10-CM | POA: Diagnosis not present

## 2022-08-19 DIAGNOSIS — I700 Atherosclerosis of aorta: Secondary | ICD-10-CM | POA: Diagnosis not present

## 2022-08-19 DIAGNOSIS — M48062 Spinal stenosis, lumbar region with neurogenic claudication: Secondary | ICD-10-CM | POA: Diagnosis not present

## 2022-08-19 DIAGNOSIS — E440 Moderate protein-calorie malnutrition: Secondary | ICD-10-CM | POA: Diagnosis not present

## 2022-08-19 DIAGNOSIS — E119 Type 2 diabetes mellitus without complications: Secondary | ICD-10-CM | POA: Diagnosis not present

## 2022-08-19 DIAGNOSIS — L02415 Cutaneous abscess of right lower limb: Secondary | ICD-10-CM | POA: Diagnosis not present

## 2022-08-19 DIAGNOSIS — Z7189 Other specified counseling: Secondary | ICD-10-CM | POA: Diagnosis not present

## 2022-08-19 DIAGNOSIS — Z0000 Encounter for general adult medical examination without abnormal findings: Secondary | ICD-10-CM | POA: Diagnosis not present

## 2022-08-19 DIAGNOSIS — J449 Chronic obstructive pulmonary disease, unspecified: Secondary | ICD-10-CM | POA: Diagnosis not present

## 2022-08-20 DIAGNOSIS — L732 Hidradenitis suppurativa: Secondary | ICD-10-CM | POA: Diagnosis not present

## 2022-08-20 DIAGNOSIS — L02214 Cutaneous abscess of groin: Secondary | ICD-10-CM | POA: Diagnosis not present

## 2022-08-20 DIAGNOSIS — L02415 Cutaneous abscess of right lower limb: Secondary | ICD-10-CM | POA: Diagnosis not present

## 2022-08-20 DIAGNOSIS — E119 Type 2 diabetes mellitus without complications: Secondary | ICD-10-CM | POA: Diagnosis not present

## 2022-08-20 DIAGNOSIS — L03115 Cellulitis of right lower limb: Secondary | ICD-10-CM | POA: Diagnosis not present

## 2022-08-20 DIAGNOSIS — Z7984 Long term (current) use of oral hypoglycemic drugs: Secondary | ICD-10-CM | POA: Diagnosis not present

## 2022-08-20 DIAGNOSIS — L03314 Cellulitis of groin: Secondary | ICD-10-CM | POA: Diagnosis not present

## 2022-08-20 DIAGNOSIS — F17290 Nicotine dependence, other tobacco product, uncomplicated: Secondary | ICD-10-CM | POA: Diagnosis not present

## 2022-08-20 DIAGNOSIS — I10 Essential (primary) hypertension: Secondary | ICD-10-CM | POA: Diagnosis not present

## 2022-08-21 DIAGNOSIS — L02415 Cutaneous abscess of right lower limb: Secondary | ICD-10-CM | POA: Diagnosis not present

## 2022-08-21 DIAGNOSIS — L732 Hidradenitis suppurativa: Secondary | ICD-10-CM | POA: Diagnosis not present

## 2022-08-21 DIAGNOSIS — L03314 Cellulitis of groin: Secondary | ICD-10-CM | POA: Diagnosis not present

## 2022-08-21 DIAGNOSIS — Z7984 Long term (current) use of oral hypoglycemic drugs: Secondary | ICD-10-CM | POA: Diagnosis not present

## 2022-08-21 DIAGNOSIS — I10 Essential (primary) hypertension: Secondary | ICD-10-CM | POA: Diagnosis not present

## 2022-08-21 DIAGNOSIS — E119 Type 2 diabetes mellitus without complications: Secondary | ICD-10-CM | POA: Diagnosis not present

## 2022-08-21 DIAGNOSIS — L03115 Cellulitis of right lower limb: Secondary | ICD-10-CM | POA: Diagnosis not present

## 2022-08-21 DIAGNOSIS — F17290 Nicotine dependence, other tobacco product, uncomplicated: Secondary | ICD-10-CM | POA: Diagnosis not present

## 2022-08-21 DIAGNOSIS — L02214 Cutaneous abscess of groin: Secondary | ICD-10-CM | POA: Diagnosis not present

## 2022-08-22 DIAGNOSIS — F17290 Nicotine dependence, other tobacco product, uncomplicated: Secondary | ICD-10-CM | POA: Diagnosis not present

## 2022-08-22 DIAGNOSIS — Z7984 Long term (current) use of oral hypoglycemic drugs: Secondary | ICD-10-CM | POA: Diagnosis not present

## 2022-08-22 DIAGNOSIS — I10 Essential (primary) hypertension: Secondary | ICD-10-CM | POA: Diagnosis not present

## 2022-08-22 DIAGNOSIS — L03115 Cellulitis of right lower limb: Secondary | ICD-10-CM | POA: Diagnosis not present

## 2022-08-22 DIAGNOSIS — L03314 Cellulitis of groin: Secondary | ICD-10-CM | POA: Diagnosis not present

## 2022-08-22 DIAGNOSIS — L02214 Cutaneous abscess of groin: Secondary | ICD-10-CM | POA: Diagnosis not present

## 2022-08-22 DIAGNOSIS — L02415 Cutaneous abscess of right lower limb: Secondary | ICD-10-CM | POA: Diagnosis not present

## 2022-08-22 DIAGNOSIS — E119 Type 2 diabetes mellitus without complications: Secondary | ICD-10-CM | POA: Diagnosis not present

## 2022-08-22 DIAGNOSIS — L732 Hidradenitis suppurativa: Secondary | ICD-10-CM | POA: Diagnosis not present

## 2022-08-23 DIAGNOSIS — Z7984 Long term (current) use of oral hypoglycemic drugs: Secondary | ICD-10-CM | POA: Diagnosis not present

## 2022-08-23 DIAGNOSIS — L732 Hidradenitis suppurativa: Secondary | ICD-10-CM | POA: Diagnosis not present

## 2022-08-23 DIAGNOSIS — I10 Essential (primary) hypertension: Secondary | ICD-10-CM | POA: Diagnosis not present

## 2022-08-23 DIAGNOSIS — E119 Type 2 diabetes mellitus without complications: Secondary | ICD-10-CM | POA: Diagnosis not present

## 2022-08-23 DIAGNOSIS — L02214 Cutaneous abscess of groin: Secondary | ICD-10-CM | POA: Diagnosis not present

## 2022-08-23 DIAGNOSIS — L03314 Cellulitis of groin: Secondary | ICD-10-CM | POA: Diagnosis not present

## 2022-08-23 DIAGNOSIS — L03115 Cellulitis of right lower limb: Secondary | ICD-10-CM | POA: Diagnosis not present

## 2022-08-23 DIAGNOSIS — L02415 Cutaneous abscess of right lower limb: Secondary | ICD-10-CM | POA: Diagnosis not present

## 2022-08-23 DIAGNOSIS — L0291 Cutaneous abscess, unspecified: Secondary | ICD-10-CM | POA: Diagnosis not present

## 2022-08-23 DIAGNOSIS — F17290 Nicotine dependence, other tobacco product, uncomplicated: Secondary | ICD-10-CM | POA: Diagnosis not present

## 2022-08-27 DIAGNOSIS — L02214 Cutaneous abscess of groin: Secondary | ICD-10-CM | POA: Diagnosis not present

## 2022-08-27 DIAGNOSIS — L03314 Cellulitis of groin: Secondary | ICD-10-CM | POA: Diagnosis not present

## 2022-08-27 DIAGNOSIS — L02415 Cutaneous abscess of right lower limb: Secondary | ICD-10-CM | POA: Diagnosis not present

## 2022-08-27 DIAGNOSIS — Z7984 Long term (current) use of oral hypoglycemic drugs: Secondary | ICD-10-CM | POA: Diagnosis not present

## 2022-08-27 DIAGNOSIS — F17290 Nicotine dependence, other tobacco product, uncomplicated: Secondary | ICD-10-CM | POA: Diagnosis not present

## 2022-08-27 DIAGNOSIS — E119 Type 2 diabetes mellitus without complications: Secondary | ICD-10-CM | POA: Diagnosis not present

## 2022-08-27 DIAGNOSIS — I10 Essential (primary) hypertension: Secondary | ICD-10-CM | POA: Diagnosis not present

## 2022-08-27 DIAGNOSIS — L732 Hidradenitis suppurativa: Secondary | ICD-10-CM | POA: Diagnosis not present

## 2022-08-27 DIAGNOSIS — L03115 Cellulitis of right lower limb: Secondary | ICD-10-CM | POA: Diagnosis not present

## 2022-08-28 DIAGNOSIS — H353291 Exudative age-related macular degeneration, unspecified eye, with active choroidal neovascularization: Secondary | ICD-10-CM | POA: Diagnosis not present

## 2022-08-28 DIAGNOSIS — L732 Hidradenitis suppurativa: Secondary | ICD-10-CM | POA: Diagnosis not present

## 2022-08-28 DIAGNOSIS — Z299 Encounter for prophylactic measures, unspecified: Secondary | ICD-10-CM | POA: Diagnosis not present

## 2022-08-28 DIAGNOSIS — I10 Essential (primary) hypertension: Secondary | ICD-10-CM | POA: Diagnosis not present

## 2022-08-28 DIAGNOSIS — E876 Hypokalemia: Secondary | ICD-10-CM | POA: Diagnosis not present

## 2022-08-28 DIAGNOSIS — E440 Moderate protein-calorie malnutrition: Secondary | ICD-10-CM | POA: Diagnosis not present

## 2022-08-28 DIAGNOSIS — E1165 Type 2 diabetes mellitus with hyperglycemia: Secondary | ICD-10-CM | POA: Diagnosis not present

## 2022-08-30 DIAGNOSIS — L02415 Cutaneous abscess of right lower limb: Secondary | ICD-10-CM | POA: Diagnosis not present

## 2022-08-30 DIAGNOSIS — F17290 Nicotine dependence, other tobacco product, uncomplicated: Secondary | ICD-10-CM | POA: Diagnosis not present

## 2022-08-30 DIAGNOSIS — Z7984 Long term (current) use of oral hypoglycemic drugs: Secondary | ICD-10-CM | POA: Diagnosis not present

## 2022-08-30 DIAGNOSIS — E119 Type 2 diabetes mellitus without complications: Secondary | ICD-10-CM | POA: Diagnosis not present

## 2022-08-30 DIAGNOSIS — L732 Hidradenitis suppurativa: Secondary | ICD-10-CM | POA: Diagnosis not present

## 2022-08-30 DIAGNOSIS — L03115 Cellulitis of right lower limb: Secondary | ICD-10-CM | POA: Diagnosis not present

## 2022-08-30 DIAGNOSIS — L03314 Cellulitis of groin: Secondary | ICD-10-CM | POA: Diagnosis not present

## 2022-08-30 DIAGNOSIS — I10 Essential (primary) hypertension: Secondary | ICD-10-CM | POA: Diagnosis not present

## 2022-08-30 DIAGNOSIS — L02214 Cutaneous abscess of groin: Secondary | ICD-10-CM | POA: Diagnosis not present

## 2022-09-03 DIAGNOSIS — H353291 Exudative age-related macular degeneration, unspecified eye, with active choroidal neovascularization: Secondary | ICD-10-CM | POA: Diagnosis not present

## 2022-09-03 DIAGNOSIS — J441 Chronic obstructive pulmonary disease with (acute) exacerbation: Secondary | ICD-10-CM | POA: Diagnosis not present

## 2022-09-03 DIAGNOSIS — R531 Weakness: Secondary | ICD-10-CM | POA: Diagnosis not present

## 2022-09-03 DIAGNOSIS — I700 Atherosclerosis of aorta: Secondary | ICD-10-CM | POA: Diagnosis not present

## 2022-09-03 DIAGNOSIS — J439 Emphysema, unspecified: Secondary | ICD-10-CM | POA: Diagnosis not present

## 2022-09-04 ENCOUNTER — Encounter (HOSPITAL_COMMUNITY): Payer: Self-pay | Admitting: Emergency Medicine

## 2022-09-04 ENCOUNTER — Other Ambulatory Visit: Payer: Self-pay

## 2022-09-04 ENCOUNTER — Emergency Department (HOSPITAL_COMMUNITY)
Admission: EM | Admit: 2022-09-04 | Discharge: 2022-09-04 | Disposition: A | Discharge status: Home / Self Care | Payer: Medicare Other | Attending: Emergency Medicine | Admitting: Emergency Medicine

## 2022-09-04 DIAGNOSIS — R531 Weakness: Secondary | ICD-10-CM | POA: Diagnosis not present

## 2022-09-04 DIAGNOSIS — Z4800 Encounter for change or removal of nonsurgical wound dressing: Secondary | ICD-10-CM | POA: Diagnosis not present

## 2022-09-04 DIAGNOSIS — Z5189 Encounter for other specified aftercare: Secondary | ICD-10-CM

## 2022-09-04 HISTORY — DX: Type 2 diabetes mellitus without complications: E11.9

## 2022-09-04 LAB — BASIC METABOLIC PANEL
Anion gap: 9 (ref 5–15)
BUN: 15 mg/dL (ref 8–23)
CO2: 22 mmol/L (ref 22–32)
Calcium: 9.4 mg/dL (ref 8.9–10.3)
Chloride: 108 mmol/L (ref 98–111)
Creatinine, Ser: 0.67 mg/dL (ref 0.61–1.24)
GFR, Estimated: >60 mL/min (ref >60)
Glucose, Bld: 97 mg/dL (ref 70–99)
Potassium: 3.7 mmol/L (ref 3.5–5.1)
Sodium: 139 mmol/L (ref 135–145)

## 2022-09-04 LAB — URINALYSIS, ROUTINE W REFLEX MICROSCOPIC
Bilirubin Urine: NEGATIVE
Glucose, UA: NEGATIVE mg/dL
Hgb urine dipstick: NEGATIVE
Ketones, ur: NEGATIVE mg/dL
Leukocytes,Ua: NEGATIVE
Nitrite: NEGATIVE
Protein, ur: NEGATIVE mg/dL
Specific Gravity, Urine: 1.019 (ref 1.005–1.030)
pH: 5 (ref 5.0–8.0)

## 2022-09-04 LAB — HEPATIC FUNCTION PANEL
ALT: 14 U/L (ref 0–44)
AST: 15 U/L (ref 15–41)
Albumin: 3.5 g/dL (ref 3.5–5.0)
Alkaline Phosphatase: 64 U/L (ref 38–126)
Bilirubin, Direct: 0.1 mg/dL (ref 0.0–0.2)
Indirect Bilirubin: 0.4 mg/dL (ref 0.3–0.9)
Total Bilirubin: 0.5 mg/dL (ref 0.3–1.2)
Total Protein: 6.7 g/dL (ref 6.5–8.1)

## 2022-09-04 LAB — CBC
HCT: 37.3 % — ABNORMAL LOW (ref 39.0–52.0)
Hemoglobin: 11.8 g/dL — ABNORMAL LOW (ref 13.0–17.0)
MCH: 31.1 pg (ref 26.0–34.0)
MCHC: 31.6 g/dL (ref 30.0–36.0)
MCV: 98.2 fL (ref 80.0–100.0)
Platelets: 286 10*3/uL (ref 150–400)
RBC: 3.8 MIL/uL — ABNORMAL LOW (ref 4.22–5.81)
RDW: 13.2 % (ref 11.5–15.5)
WBC: 8.7 10*3/uL (ref 4.0–10.5)
nRBC: 0 % (ref 0.0–0.2)

## 2022-09-04 LAB — MAGNESIUM: Magnesium: 1.9 mg/dL (ref 1.7–2.4)

## 2022-09-04 MED ORDER — SULFAMETHOXAZOLE-TRIMETHOPRIM 800-160 MG PO TABS: 1.0000 | ORAL_TABLET | Freq: Two times a day (BID) | ORAL | 0 refills | Status: AC

## 2022-09-04 NOTE — Discharge Instructions (Addendum)
Your testing has been reassuring, no significant signs of severe infection, take the medication which I prescribed twice a day for 7 days, this is called Bactrim to help get rid of any residual bacteria that may be in your wound.  You may resume your normal wound care with packing, ER for worsening symptoms, call your surgeon to be seen within the next week for recheck.  [facility] Primary Care Doctor List    Syliva Overman, MD. Specialty: [facility] Medicine Contact information: [address]  [place] [address]  [phone]   Lilyan Punt, MD. Specialty: [facility] Medicine Contact information: [address]  [place] [address]  [phone]   Avon Gully, MD Specialty: Internal Medicine Contact information: [address] [place] [address]  [phone]   Catalina Pizza, MD. Specialty: Internal Medicine Contact information: [address]  [place] [address]  [phone]    [facility] (Dr. Selena Batten) Specialty: Family Medicine Contact information: [address]  [place] [address]  [phone]   John Giovanni, MD. Specialty: [facility] Medicine Contact information: [address]  [address]  [place] [address]  [phone]   Carylon Perches, MD. Specialty: Internal Medicine Contact information: [address]  [address]  [place] [address]  [phone]    [facility]  [address] [place], [place] [address] [phone]  Services [facility] offers a variety of basic health services.  Services include but are not limited to: Blood pressure checks  Heart rate checks  Blood sugar checks  Urine analysis  Rapid strep tests  Pregnancy tests.  Health education and referrals  People needing more complex services will be directed to a physician online. Using these virtual visits, doctors can evaluate and prescribe medicine and  treatments. There will be no medication on-site, though [facility] will help patients fill their prescriptions at little to no cost.   For More information please go to: [email or web address]

## 2022-09-04 NOTE — ED Triage Notes (Signed)
Pt dx with cellulites/open wound to genital area in march. Pt with friend and  both states pt has "gone down hill" since then. Pt c/o being weak all over and lost weight since March. Pt having to go get area packed every day. Pt is a/o. Pt using cane and unsteady gait when ambulated to triage room. Color wnl.

## 2022-09-04 NOTE — ED Notes (Signed)
Wound near R groin packed by MD

## 2022-09-04 NOTE — ED Notes (Signed)
Pt given water for fluid challenge, also informed of need for urine specimen and given a urinal

## 2022-09-04 NOTE — ED Provider Notes (Signed)
[facility] EMERGENCY DEPARTMENT AT [facility] Provider Note   CSN: [id number] Arrival date & time: 09/04/22  1206     History  Chief Complaint  Patient presents with   Weakness    Martin White is a 67 y.o. male.   Weakness  This patient is a 67 year old male, treated for recurrent infections in the inguinal regions, in fact the medical record review which I performed shows that he had a incision and drainage of multiple different lesions in his groin back at the beginning of April of this year approximately 2 months ago.  Since that time he has been following up with wound care and having his dressings changed and the right proximal anteromedial thigh wound repacked approximately every other day.  He comes in today because he has been feeling some generalized weakness but is also concerned that he may have recurrent infection in his groin.  He states this because the wounds are no longer draining and he feels a lump there, he does not have any fevers or chills, no nausea or vomiting, he is struggling with constipation because of his chronic opiate use and does not want to take laxatives because he states it makes him have incontinence.    Home Medications Prior to Admission medications   Medication Sig Start Date End Date Taking? Authorizing Provider  sulfamethoxazole-trimethoprim (BACTRIM DS) 800-160 MG tablet Take 1 tablet by mouth 2 (two) times daily for 7 days. 09/04/22 09/11/22 Yes Eber Hong, MD      Allergies    Patient has no known allergies.    Review of Systems   Review of Systems  Neurological:  Positive for weakness.  All other systems reviewed and are negative.   Physical Exam Updated Vital Signs BP 131/67   Pulse 77   Temp 98.6 F (37 C) (Oral)   Resp (!) 22   SpO2 97%  Physical Exam Vitals and nursing note reviewed.  Constitutional:      General: He is not in acute distress.    Appearance: He is well-developed.  HENT:     Head:  Normocephalic and atraumatic.     Mouth/Throat:     Pharynx: No oropharyngeal exudate.  Eyes:     General: No scleral icterus.       Right eye: No discharge.        Left eye: No discharge.     Conjunctiva/sclera: Conjunctivae normal.     Pupils: Pupils are equal, round, and reactive to light.  Neck:     Thyroid: No thyromegaly.     Vascular: No JVD.  Cardiovascular:     Rate and Rhythm: Normal rate and regular rhythm.     Heart sounds: Normal heart sounds. No murmur heard.    No friction rub. No gallop.  Pulmonary:     Effort: Pulmonary effort is normal. No respiratory distress.     Breath sounds: Normal breath sounds. No wheezing or rales.  Abdominal:     General: Bowel sounds are normal. There is no distension.     Palpations: Abdomen is soft. There is no mass.     Tenderness: There is no abdominal tenderness.  Genitourinary:    Comments: Totally normal genitourinary exam without any significant findings or lesions, the scrotum is free of any redness or wounds, the penile shaft is normal and the penile head is normal, his bilateral inguinal regions were examined and there does appear to be small areas of fullness but no significant tenderness redness  or draining wounds, there is no induration or fluctuance. Musculoskeletal:        General: No tenderness. Normal range of motion.     Cervical back: Normal range of motion and neck supple.     Right lower leg: No edema.     Left lower leg: No edema.  Lymphadenopathy:     Cervical: No cervical adenopathy.  Skin:    General: Skin is warm and dry.     Findings: No erythema or rash.     Comments: There is a slightly open draining wound to the right proximal anteromedial thigh, there is no surrounding redness or tenderness, I cannot express any purulence  Neurological:     Mental Status: He is alert.     Coordination: Coordination normal.  Psychiatric:        Behavior: Behavior normal.     ED Results / Procedures / Treatments    Labs (all labs ordered are listed, but only abnormal results are displayed) Labs Reviewed  CBC - Abnormal; Notable for the following components:      Result Value   RBC 3.80 (*)    Hemoglobin 11.8 (*)    HCT 37.3 (*)    All other components within normal limits  BASIC METABOLIC PANEL  URINALYSIS, ROUTINE W REFLEX MICROSCOPIC  HEPATIC FUNCTION PANEL  MAGNESIUM    EKG EKG Interpretation  Date/Time:  Wednesday September 04 2022 12:21:49 EDT Ventricular Rate:  79 PR Interval:  166 QRS Duration: 88 QT Interval:  366 QTC Calculation: 419 R Axis:   33 Text Interpretation: Normal sinus rhythm Nonspecific ST abnormality Abnormal ECG When compared with ECG of 03-Sep-2019 10:19, Premature supraventricular complexes are no longer Present Incomplete right bundle branch block is no longer Present Minimal criteria for Septal infarct are no longer Present Confirmed by Eber Hong ([id number]) on 09/04/2022 12:24:02 PM  Radiology No results found.  Procedures Procedures    Medications Ordered in ED Medications - No data to display  ED Course/ Medical Decision Making/ A&P                             Medical Decision Making Amount and/or Complexity of Data Reviewed Labs: ordered.  Risk Prescription drug management.    This patient presents to the ED for concern of poorly healing wounds, weight loss, generalized weakness differential diagnosis includes electrolyte abnormalities, renal dysfunction, recurrent infection though that seems less likely given his clinical exam with normal vital signs and no fever    Additional history obtained:  Additional history obtained from medical record External records from outside source obtained and reviewed including surgical notes   Lab Tests:  I Ordered, and personally interpreted labs.  The pertinent results include: Unremarkable workup, no significant leukocytosis, renal function preserved, urinalysis without infection   Medicines ordered  and prescription drug management:  I ordered medication including Bactrim for possible residual infection  I have reviewed the patients home medicines and have made adjustments as needed   Problem List / ED Course:  Stable appearing, I repacked his wound on the right leg, sterile dressing applied, vitals normal   Social Determinants of Health:   The patient is not actually weak on exam, he is well-appearing, stable for discharge          Final Clinical Impression(s) / ED Diagnoses Final diagnoses:  Weakness  Encounter for wound care    Rx / DC Orders ED Discharge Orders  Ordered    sulfamethoxazole-trimethoprim (BACTRIM DS) 800-160 MG tablet  2 times daily        09/04/22 1454              Eber Hong, MD 09/04/22 1459

## 2022-09-09 DIAGNOSIS — Z7984 Long term (current) use of oral hypoglycemic drugs: Secondary | ICD-10-CM | POA: Diagnosis not present

## 2022-09-09 DIAGNOSIS — E119 Type 2 diabetes mellitus without complications: Secondary | ICD-10-CM | POA: Diagnosis not present

## 2022-09-09 DIAGNOSIS — L02415 Cutaneous abscess of right lower limb: Secondary | ICD-10-CM | POA: Diagnosis not present

## 2022-09-09 DIAGNOSIS — F17290 Nicotine dependence, other tobacco product, uncomplicated: Secondary | ICD-10-CM | POA: Diagnosis not present

## 2022-09-09 DIAGNOSIS — L03314 Cellulitis of groin: Secondary | ICD-10-CM | POA: Diagnosis not present

## 2022-09-09 DIAGNOSIS — L02214 Cutaneous abscess of groin: Secondary | ICD-10-CM | POA: Diagnosis not present

## 2022-09-09 DIAGNOSIS — I10 Essential (primary) hypertension: Secondary | ICD-10-CM | POA: Diagnosis not present

## 2022-09-09 DIAGNOSIS — L732 Hidradenitis suppurativa: Secondary | ICD-10-CM | POA: Diagnosis not present

## 2022-09-09 DIAGNOSIS — L03115 Cellulitis of right lower limb: Secondary | ICD-10-CM | POA: Diagnosis not present

## 2022-09-11 DIAGNOSIS — I10 Essential (primary) hypertension: Secondary | ICD-10-CM | POA: Diagnosis not present

## 2022-09-11 DIAGNOSIS — E1165 Type 2 diabetes mellitus with hyperglycemia: Secondary | ICD-10-CM | POA: Diagnosis not present

## 2022-09-11 DIAGNOSIS — R634 Abnormal weight loss: Secondary | ICD-10-CM | POA: Diagnosis not present

## 2022-09-11 DIAGNOSIS — E440 Moderate protein-calorie malnutrition: Secondary | ICD-10-CM | POA: Diagnosis not present

## 2022-09-11 DIAGNOSIS — Z299 Encounter for prophylactic measures, unspecified: Secondary | ICD-10-CM | POA: Diagnosis not present

## 2022-09-13 DIAGNOSIS — E119 Type 2 diabetes mellitus without complications: Secondary | ICD-10-CM | POA: Diagnosis not present

## 2022-09-13 DIAGNOSIS — L03115 Cellulitis of right lower limb: Secondary | ICD-10-CM | POA: Diagnosis not present

## 2022-09-13 DIAGNOSIS — L02214 Cutaneous abscess of groin: Secondary | ICD-10-CM | POA: Diagnosis not present

## 2022-09-13 DIAGNOSIS — L03314 Cellulitis of groin: Secondary | ICD-10-CM | POA: Diagnosis not present

## 2022-09-13 DIAGNOSIS — L732 Hidradenitis suppurativa: Secondary | ICD-10-CM | POA: Diagnosis not present

## 2022-09-13 DIAGNOSIS — F17290 Nicotine dependence, other tobacco product, uncomplicated: Secondary | ICD-10-CM | POA: Diagnosis not present

## 2022-09-13 DIAGNOSIS — I10 Essential (primary) hypertension: Secondary | ICD-10-CM | POA: Diagnosis not present

## 2022-09-13 DIAGNOSIS — Z7984 Long term (current) use of oral hypoglycemic drugs: Secondary | ICD-10-CM | POA: Diagnosis not present

## 2022-09-13 DIAGNOSIS — L02415 Cutaneous abscess of right lower limb: Secondary | ICD-10-CM | POA: Diagnosis not present

## 2022-09-18 DIAGNOSIS — R1084 Generalized abdominal pain: Secondary | ICD-10-CM | POA: Diagnosis not present

## 2022-09-18 DIAGNOSIS — R1031 Right lower quadrant pain: Secondary | ICD-10-CM | POA: Diagnosis not present

## 2022-09-18 DIAGNOSIS — Z86010 Personal history of colonic polyps: Secondary | ICD-10-CM | POA: Diagnosis not present

## 2022-09-18 DIAGNOSIS — K5900 Constipation, unspecified: Secondary | ICD-10-CM | POA: Diagnosis not present

## 2022-09-18 DIAGNOSIS — R634 Abnormal weight loss: Secondary | ICD-10-CM | POA: Diagnosis not present

## 2022-09-19 DIAGNOSIS — E440 Moderate protein-calorie malnutrition: Secondary | ICD-10-CM | POA: Diagnosis not present

## 2022-09-19 DIAGNOSIS — I10 Essential (primary) hypertension: Secondary | ICD-10-CM | POA: Diagnosis not present

## 2022-09-19 DIAGNOSIS — G720 Drug-induced myopathy: Secondary | ICD-10-CM | POA: Diagnosis not present

## 2022-09-19 DIAGNOSIS — Z299 Encounter for prophylactic measures, unspecified: Secondary | ICD-10-CM | POA: Diagnosis not present

## 2022-09-19 DIAGNOSIS — M48062 Spinal stenosis, lumbar region with neurogenic claudication: Secondary | ICD-10-CM | POA: Diagnosis not present

## 2022-09-23 DIAGNOSIS — K5730 Diverticulosis of large intestine without perforation or abscess without bleeding: Secondary | ICD-10-CM | POA: Diagnosis not present

## 2022-09-23 DIAGNOSIS — Z1211 Encounter for screening for malignant neoplasm of colon: Secondary | ICD-10-CM | POA: Diagnosis not present

## 2022-09-23 DIAGNOSIS — K2950 Unspecified chronic gastritis without bleeding: Secondary | ICD-10-CM | POA: Diagnosis not present

## 2022-09-23 DIAGNOSIS — K3189 Other diseases of stomach and duodenum: Secondary | ICD-10-CM | POA: Diagnosis not present

## 2022-09-23 DIAGNOSIS — Z09 Encounter for follow-up examination after completed treatment for conditions other than malignant neoplasm: Secondary | ICD-10-CM | POA: Diagnosis not present

## 2022-09-23 DIAGNOSIS — J449 Chronic obstructive pulmonary disease, unspecified: Secondary | ICD-10-CM | POA: Diagnosis not present

## 2022-09-23 DIAGNOSIS — R634 Abnormal weight loss: Secondary | ICD-10-CM | POA: Diagnosis not present

## 2022-09-23 DIAGNOSIS — Z86010 Personal history of colonic polyps: Secondary | ICD-10-CM | POA: Diagnosis not present

## 2022-09-24 DIAGNOSIS — L663 Perifolliculitis capitis abscedens: Secondary | ICD-10-CM | POA: Diagnosis not present

## 2022-09-27 DIAGNOSIS — R634 Abnormal weight loss: Secondary | ICD-10-CM | POA: Diagnosis not present

## 2022-09-27 DIAGNOSIS — N200 Calculus of kidney: Secondary | ICD-10-CM | POA: Diagnosis not present

## 2022-09-27 DIAGNOSIS — I700 Atherosclerosis of aorta: Secondary | ICD-10-CM | POA: Diagnosis not present

## 2022-09-27 DIAGNOSIS — R1031 Right lower quadrant pain: Secondary | ICD-10-CM | POA: Diagnosis not present

## 2022-10-11 DIAGNOSIS — Z7984 Long term (current) use of oral hypoglycemic drugs: Secondary | ICD-10-CM | POA: Diagnosis not present

## 2022-10-11 DIAGNOSIS — I10 Essential (primary) hypertension: Secondary | ICD-10-CM | POA: Diagnosis not present

## 2022-10-11 DIAGNOSIS — L732 Hidradenitis suppurativa: Secondary | ICD-10-CM | POA: Diagnosis not present

## 2022-10-11 DIAGNOSIS — E119 Type 2 diabetes mellitus without complications: Secondary | ICD-10-CM | POA: Diagnosis not present

## 2022-10-11 DIAGNOSIS — Z79899 Other long term (current) drug therapy: Secondary | ICD-10-CM | POA: Diagnosis not present

## 2022-10-11 DIAGNOSIS — J449 Chronic obstructive pulmonary disease, unspecified: Secondary | ICD-10-CM | POA: Diagnosis not present

## 2022-10-11 DIAGNOSIS — L02214 Cutaneous abscess of groin: Secondary | ICD-10-CM | POA: Diagnosis not present

## 2022-10-15 DIAGNOSIS — R69 Illness, unspecified: Secondary | ICD-10-CM | POA: Diagnosis not present

## 2022-10-15 DIAGNOSIS — Z743 Need for continuous supervision: Secondary | ICD-10-CM | POA: Diagnosis not present

## 2022-10-25 DIAGNOSIS — M549 Dorsalgia, unspecified: Secondary | ICD-10-CM | POA: Diagnosis not present

## 2022-10-25 DIAGNOSIS — I10 Essential (primary) hypertension: Secondary | ICD-10-CM | POA: Diagnosis not present

## 2022-10-25 DIAGNOSIS — Z299 Encounter for prophylactic measures, unspecified: Secondary | ICD-10-CM | POA: Diagnosis not present

## 2022-10-25 DIAGNOSIS — G8929 Other chronic pain: Secondary | ICD-10-CM | POA: Diagnosis not present

## 2022-10-25 DIAGNOSIS — B350 Tinea barbae and tinea capitis: Secondary | ICD-10-CM | POA: Diagnosis not present

## 2022-10-25 DIAGNOSIS — E1165 Type 2 diabetes mellitus with hyperglycemia: Secondary | ICD-10-CM | POA: Diagnosis not present

## 2022-10-30 DIAGNOSIS — R29898 Other symptoms and signs involving the musculoskeletal system: Secondary | ICD-10-CM | POA: Diagnosis not present

## 2022-10-30 DIAGNOSIS — Z20822 Contact with and (suspected) exposure to covid-19: Secondary | ICD-10-CM | POA: Diagnosis not present

## 2022-10-30 DIAGNOSIS — E860 Dehydration: Secondary | ICD-10-CM | POA: Diagnosis not present

## 2022-10-30 DIAGNOSIS — M5126 Other intervertebral disc displacement, lumbar region: Secondary | ICD-10-CM | POA: Diagnosis not present

## 2022-10-30 DIAGNOSIS — J449 Chronic obstructive pulmonary disease, unspecified: Secondary | ICD-10-CM | POA: Diagnosis not present

## 2022-10-30 DIAGNOSIS — I10 Essential (primary) hypertension: Secondary | ICD-10-CM | POA: Diagnosis not present

## 2022-10-30 DIAGNOSIS — E119 Type 2 diabetes mellitus without complications: Secondary | ICD-10-CM | POA: Diagnosis not present

## 2022-10-30 DIAGNOSIS — Z299 Encounter for prophylactic measures, unspecified: Secondary | ICD-10-CM | POA: Diagnosis not present

## 2022-10-30 DIAGNOSIS — R531 Weakness: Secondary | ICD-10-CM | POA: Diagnosis not present

## 2022-10-30 DIAGNOSIS — E440 Moderate protein-calorie malnutrition: Secondary | ICD-10-CM | POA: Diagnosis not present

## 2022-10-30 DIAGNOSIS — I700 Atherosclerosis of aorta: Secondary | ICD-10-CM | POA: Diagnosis not present

## 2022-10-30 DIAGNOSIS — M5106 Intervertebral disc disorders with myelopathy, lumbar region: Secondary | ICD-10-CM | POA: Diagnosis not present

## 2022-10-30 DIAGNOSIS — R5383 Other fatigue: Secondary | ICD-10-CM | POA: Diagnosis not present

## 2022-11-14 DIAGNOSIS — J449 Chronic obstructive pulmonary disease, unspecified: Secondary | ICD-10-CM | POA: Diagnosis not present

## 2022-11-14 DIAGNOSIS — Z299 Encounter for prophylactic measures, unspecified: Secondary | ICD-10-CM | POA: Diagnosis not present

## 2022-11-14 DIAGNOSIS — T63301A Toxic effect of unspecified spider venom, accidental (unintentional), initial encounter: Secondary | ICD-10-CM | POA: Diagnosis not present

## 2022-11-14 DIAGNOSIS — E440 Moderate protein-calorie malnutrition: Secondary | ICD-10-CM | POA: Diagnosis not present

## 2022-11-14 DIAGNOSIS — D692 Other nonthrombocytopenic purpura: Secondary | ICD-10-CM | POA: Diagnosis not present

## 2022-11-18 DIAGNOSIS — Z22322 Carrier or suspected carrier of Methicillin resistant Staphylococcus aureus: Secondary | ICD-10-CM | POA: Diagnosis not present

## 2022-11-18 DIAGNOSIS — I10 Essential (primary) hypertension: Secondary | ICD-10-CM | POA: Diagnosis not present

## 2022-11-18 DIAGNOSIS — Z299 Encounter for prophylactic measures, unspecified: Secondary | ICD-10-CM | POA: Diagnosis not present

## 2022-11-18 DIAGNOSIS — L720 Epidermal cyst: Secondary | ICD-10-CM | POA: Diagnosis not present

## 2022-11-20 DIAGNOSIS — Z79899 Other long term (current) drug therapy: Secondary | ICD-10-CM | POA: Diagnosis not present

## 2022-11-20 DIAGNOSIS — E119 Type 2 diabetes mellitus without complications: Secondary | ICD-10-CM | POA: Diagnosis not present

## 2022-11-20 DIAGNOSIS — F17290 Nicotine dependence, other tobacco product, uncomplicated: Secondary | ICD-10-CM | POA: Diagnosis not present

## 2022-11-20 DIAGNOSIS — J449 Chronic obstructive pulmonary disease, unspecified: Secondary | ICD-10-CM | POA: Diagnosis not present

## 2022-11-20 DIAGNOSIS — L02415 Cutaneous abscess of right lower limb: Secondary | ICD-10-CM | POA: Diagnosis not present

## 2022-11-20 DIAGNOSIS — Z7984 Long term (current) use of oral hypoglycemic drugs: Secondary | ICD-10-CM | POA: Diagnosis not present

## 2022-11-20 DIAGNOSIS — I10 Essential (primary) hypertension: Secondary | ICD-10-CM | POA: Diagnosis not present

## 2022-11-20 DIAGNOSIS — L02413 Cutaneous abscess of right upper limb: Secondary | ICD-10-CM | POA: Diagnosis not present

## 2022-11-22 DIAGNOSIS — R634 Abnormal weight loss: Secondary | ICD-10-CM | POA: Diagnosis not present

## 2022-11-22 DIAGNOSIS — R5383 Other fatigue: Secondary | ICD-10-CM | POA: Diagnosis not present

## 2022-11-22 DIAGNOSIS — Z79899 Other long term (current) drug therapy: Secondary | ICD-10-CM | POA: Diagnosis not present

## 2022-11-22 DIAGNOSIS — I10 Essential (primary) hypertension: Secondary | ICD-10-CM | POA: Diagnosis not present

## 2022-11-22 DIAGNOSIS — Z299 Encounter for prophylactic measures, unspecified: Secondary | ICD-10-CM | POA: Diagnosis not present

## 2022-11-22 DIAGNOSIS — Z0000 Encounter for general adult medical examination without abnormal findings: Secondary | ICD-10-CM | POA: Diagnosis not present

## 2022-11-22 DIAGNOSIS — J449 Chronic obstructive pulmonary disease, unspecified: Secondary | ICD-10-CM | POA: Diagnosis not present

## 2022-12-03 DIAGNOSIS — A4902 Methicillin resistant Staphylococcus aureus infection, unspecified site: Secondary | ICD-10-CM | POA: Diagnosis not present

## 2022-12-03 DIAGNOSIS — L02413 Cutaneous abscess of right upper limb: Secondary | ICD-10-CM | POA: Diagnosis not present

## 2022-12-17 DIAGNOSIS — L02413 Cutaneous abscess of right upper limb: Secondary | ICD-10-CM | POA: Diagnosis not present

## 2022-12-17 DIAGNOSIS — A4902 Methicillin resistant Staphylococcus aureus infection, unspecified site: Secondary | ICD-10-CM | POA: Diagnosis not present

## 2022-12-17 DIAGNOSIS — B9562 Methicillin resistant Staphylococcus aureus infection as the cause of diseases classified elsewhere: Secondary | ICD-10-CM | POA: Diagnosis not present

## 2022-12-17 DIAGNOSIS — L089 Local infection of the skin and subcutaneous tissue, unspecified: Secondary | ICD-10-CM | POA: Diagnosis not present

## 2022-12-24 DIAGNOSIS — M549 Dorsalgia, unspecified: Secondary | ICD-10-CM | POA: Diagnosis not present

## 2022-12-24 DIAGNOSIS — G8929 Other chronic pain: Secondary | ICD-10-CM | POA: Diagnosis not present

## 2022-12-24 DIAGNOSIS — Z299 Encounter for prophylactic measures, unspecified: Secondary | ICD-10-CM | POA: Diagnosis not present

## 2022-12-24 DIAGNOSIS — I10 Essential (primary) hypertension: Secondary | ICD-10-CM | POA: Diagnosis not present

## 2022-12-24 DIAGNOSIS — E1165 Type 2 diabetes mellitus with hyperglycemia: Secondary | ICD-10-CM | POA: Diagnosis not present

## 2022-12-25 DIAGNOSIS — I10 Essential (primary) hypertension: Secondary | ICD-10-CM | POA: Diagnosis not present

## 2022-12-25 DIAGNOSIS — B370 Candidal stomatitis: Secondary | ICD-10-CM | POA: Diagnosis not present

## 2022-12-25 DIAGNOSIS — L02413 Cutaneous abscess of right upper limb: Secondary | ICD-10-CM | POA: Diagnosis not present

## 2022-12-25 DIAGNOSIS — A4902 Methicillin resistant Staphylococcus aureus infection, unspecified site: Secondary | ICD-10-CM | POA: Diagnosis not present

## 2022-12-25 DIAGNOSIS — R634 Abnormal weight loss: Secondary | ICD-10-CM | POA: Diagnosis not present

## 2023-01-06 DIAGNOSIS — J439 Emphysema, unspecified: Secondary | ICD-10-CM | POA: Diagnosis not present

## 2023-01-06 DIAGNOSIS — L089 Local infection of the skin and subcutaneous tissue, unspecified: Secondary | ICD-10-CM | POA: Diagnosis not present

## 2023-01-06 DIAGNOSIS — B9562 Methicillin resistant Staphylococcus aureus infection as the cause of diseases classified elsewhere: Secondary | ICD-10-CM | POA: Diagnosis not present

## 2023-01-06 DIAGNOSIS — M545 Low back pain, unspecified: Secondary | ICD-10-CM | POA: Diagnosis not present

## 2023-01-06 DIAGNOSIS — R634 Abnormal weight loss: Secondary | ICD-10-CM | POA: Diagnosis not present

## 2023-01-06 DIAGNOSIS — G8929 Other chronic pain: Secondary | ICD-10-CM | POA: Diagnosis not present

## 2023-01-16 DIAGNOSIS — R634 Abnormal weight loss: Secondary | ICD-10-CM | POA: Diagnosis not present

## 2023-01-20 DIAGNOSIS — F17210 Nicotine dependence, cigarettes, uncomplicated: Secondary | ICD-10-CM | POA: Diagnosis not present

## 2023-01-20 DIAGNOSIS — J432 Centrilobular emphysema: Secondary | ICD-10-CM | POA: Diagnosis not present

## 2023-01-24 DIAGNOSIS — Z299 Encounter for prophylactic measures, unspecified: Secondary | ICD-10-CM | POA: Diagnosis not present

## 2023-01-24 DIAGNOSIS — Z79899 Other long term (current) drug therapy: Secondary | ICD-10-CM | POA: Diagnosis not present

## 2023-01-24 DIAGNOSIS — E440 Moderate protein-calorie malnutrition: Secondary | ICD-10-CM | POA: Diagnosis not present

## 2023-01-24 DIAGNOSIS — M549 Dorsalgia, unspecified: Secondary | ICD-10-CM | POA: Diagnosis not present

## 2023-01-24 DIAGNOSIS — J449 Chronic obstructive pulmonary disease, unspecified: Secondary | ICD-10-CM | POA: Diagnosis not present

## 2023-01-24 DIAGNOSIS — G8929 Other chronic pain: Secondary | ICD-10-CM | POA: Diagnosis not present

## 2023-01-24 DIAGNOSIS — I10 Essential (primary) hypertension: Secondary | ICD-10-CM | POA: Diagnosis not present

## 2023-02-10 DIAGNOSIS — Z79891 Long term (current) use of opiate analgesic: Secondary | ICD-10-CM | POA: Diagnosis not present

## 2023-02-10 DIAGNOSIS — G894 Chronic pain syndrome: Secondary | ICD-10-CM | POA: Diagnosis not present

## 2023-02-18 DIAGNOSIS — M542 Cervicalgia: Secondary | ICD-10-CM | POA: Diagnosis not present

## 2023-02-18 DIAGNOSIS — M5136 Other intervertebral disc degeneration, lumbar region with discogenic back pain only: Secondary | ICD-10-CM | POA: Diagnosis not present

## 2023-02-18 DIAGNOSIS — G8929 Other chronic pain: Secondary | ICD-10-CM | POA: Diagnosis not present

## 2023-02-18 DIAGNOSIS — M549 Dorsalgia, unspecified: Secondary | ICD-10-CM | POA: Diagnosis not present

## 2023-02-18 DIAGNOSIS — R634 Abnormal weight loss: Secondary | ICD-10-CM | POA: Diagnosis not present

## 2023-02-18 DIAGNOSIS — J432 Centrilobular emphysema: Secondary | ICD-10-CM | POA: Diagnosis not present

## 2023-03-10 DIAGNOSIS — G894 Chronic pain syndrome: Secondary | ICD-10-CM | POA: Diagnosis not present

## 2023-03-10 DIAGNOSIS — M5451 Vertebrogenic low back pain: Secondary | ICD-10-CM | POA: Diagnosis not present

## 2023-03-10 DIAGNOSIS — Z79891 Long term (current) use of opiate analgesic: Secondary | ICD-10-CM | POA: Diagnosis not present

## 2023-03-10 DIAGNOSIS — M542 Cervicalgia: Secondary | ICD-10-CM | POA: Diagnosis not present

## 2023-03-21 DIAGNOSIS — M51361 Other intervertebral disc degeneration, lumbar region with lower extremity pain only: Secondary | ICD-10-CM | POA: Diagnosis not present

## 2023-04-22 DIAGNOSIS — B9562 Methicillin resistant Staphylococcus aureus infection as the cause of diseases classified elsewhere: Secondary | ICD-10-CM | POA: Diagnosis not present

## 2023-04-22 DIAGNOSIS — L089 Local infection of the skin and subcutaneous tissue, unspecified: Secondary | ICD-10-CM | POA: Diagnosis not present

## 2023-04-29 DIAGNOSIS — L110 Acquired keratosis follicularis: Secondary | ICD-10-CM | POA: Diagnosis not present

## 2023-04-29 DIAGNOSIS — M79674 Pain in right toe(s): Secondary | ICD-10-CM | POA: Diagnosis not present

## 2023-04-29 DIAGNOSIS — I739 Peripheral vascular disease, unspecified: Secondary | ICD-10-CM | POA: Diagnosis not present

## 2023-04-29 DIAGNOSIS — M79672 Pain in left foot: Secondary | ICD-10-CM | POA: Diagnosis not present

## 2023-04-29 DIAGNOSIS — M79675 Pain in left toe(s): Secondary | ICD-10-CM | POA: Diagnosis not present

## 2023-04-29 DIAGNOSIS — M79671 Pain in right foot: Secondary | ICD-10-CM | POA: Diagnosis not present

## 2023-05-06 DIAGNOSIS — Z981 Arthrodesis status: Secondary | ICD-10-CM | POA: Diagnosis not present

## 2023-05-06 DIAGNOSIS — R1310 Dysphagia, unspecified: Secondary | ICD-10-CM | POA: Diagnosis not present

## 2023-05-06 DIAGNOSIS — M48062 Spinal stenosis, lumbar region with neurogenic claudication: Secondary | ICD-10-CM | POA: Diagnosis not present

## 2023-05-06 DIAGNOSIS — M4802 Spinal stenosis, cervical region: Secondary | ICD-10-CM | POA: Diagnosis not present

## 2023-05-19 DIAGNOSIS — G894 Chronic pain syndrome: Secondary | ICD-10-CM | POA: Diagnosis not present

## 2023-05-19 DIAGNOSIS — M542 Cervicalgia: Secondary | ICD-10-CM | POA: Diagnosis not present

## 2023-05-19 DIAGNOSIS — Z79891 Long term (current) use of opiate analgesic: Secondary | ICD-10-CM | POA: Diagnosis not present

## 2023-05-19 DIAGNOSIS — M5451 Vertebrogenic low back pain: Secondary | ICD-10-CM | POA: Diagnosis not present

## 2023-05-20 DIAGNOSIS — R627 Adult failure to thrive: Secondary | ICD-10-CM | POA: Diagnosis not present

## 2023-05-20 DIAGNOSIS — R1314 Dysphagia, pharyngoesophageal phase: Secondary | ICD-10-CM | POA: Diagnosis not present

## 2023-05-20 DIAGNOSIS — T84296A Other mechanical complication of internal fixation device of vertebrae, initial encounter: Secondary | ICD-10-CM | POA: Diagnosis not present

## 2023-05-20 DIAGNOSIS — R1310 Dysphagia, unspecified: Secondary | ICD-10-CM | POA: Diagnosis not present

## 2023-05-27 DIAGNOSIS — R0609 Other forms of dyspnea: Secondary | ICD-10-CM | POA: Diagnosis not present

## 2023-05-27 DIAGNOSIS — R638 Other symptoms and signs concerning food and fluid intake: Secondary | ICD-10-CM | POA: Diagnosis not present

## 2023-05-27 DIAGNOSIS — E46 Unspecified protein-calorie malnutrition: Secondary | ICD-10-CM | POA: Diagnosis not present

## 2023-05-27 DIAGNOSIS — R627 Adult failure to thrive: Secondary | ICD-10-CM | POA: Diagnosis not present

## 2023-05-28 DIAGNOSIS — M48062 Spinal stenosis, lumbar region with neurogenic claudication: Secondary | ICD-10-CM | POA: Diagnosis not present

## 2023-05-28 DIAGNOSIS — M4802 Spinal stenosis, cervical region: Secondary | ICD-10-CM | POA: Diagnosis not present

## 2023-05-28 DIAGNOSIS — Z981 Arthrodesis status: Secondary | ICD-10-CM | POA: Diagnosis not present

## 2023-05-28 DIAGNOSIS — R1310 Dysphagia, unspecified: Secondary | ICD-10-CM | POA: Diagnosis not present

## 2023-05-28 DIAGNOSIS — E882 Lipomatosis, not elsewhere classified: Secondary | ICD-10-CM | POA: Diagnosis not present

## 2023-05-28 DIAGNOSIS — M4807 Spinal stenosis, lumbosacral region: Secondary | ICD-10-CM | POA: Diagnosis not present

## 2023-05-30 DIAGNOSIS — Z981 Arthrodesis status: Secondary | ICD-10-CM | POA: Diagnosis not present

## 2023-05-30 DIAGNOSIS — M4802 Spinal stenosis, cervical region: Secondary | ICD-10-CM | POA: Diagnosis not present

## 2023-05-30 DIAGNOSIS — M48062 Spinal stenosis, lumbar region with neurogenic claudication: Secondary | ICD-10-CM | POA: Diagnosis not present

## 2023-06-04 DIAGNOSIS — R634 Abnormal weight loss: Secondary | ICD-10-CM | POA: Diagnosis not present

## 2023-06-04 DIAGNOSIS — R627 Adult failure to thrive: Secondary | ICD-10-CM | POA: Diagnosis not present

## 2023-06-06 DIAGNOSIS — N200 Calculus of kidney: Secondary | ICD-10-CM | POA: Diagnosis not present

## 2023-06-06 DIAGNOSIS — R634 Abnormal weight loss: Secondary | ICD-10-CM | POA: Diagnosis not present

## 2023-06-06 DIAGNOSIS — R627 Adult failure to thrive: Secondary | ICD-10-CM | POA: Diagnosis not present

## 2023-06-06 DIAGNOSIS — J430 Unilateral pulmonary emphysema [MacLeod's syndrome]: Secondary | ICD-10-CM | POA: Diagnosis not present

## 2023-06-06 DIAGNOSIS — J439 Emphysema, unspecified: Secondary | ICD-10-CM | POA: Diagnosis not present

## 2023-06-16 DIAGNOSIS — Z79891 Long term (current) use of opiate analgesic: Secondary | ICD-10-CM | POA: Diagnosis not present

## 2023-06-16 DIAGNOSIS — M542 Cervicalgia: Secondary | ICD-10-CM | POA: Diagnosis not present

## 2023-06-16 DIAGNOSIS — G894 Chronic pain syndrome: Secondary | ICD-10-CM | POA: Diagnosis not present

## 2023-06-16 DIAGNOSIS — M5451 Vertebrogenic low back pain: Secondary | ICD-10-CM | POA: Diagnosis not present

## 2023-06-17 DIAGNOSIS — Q809 Congenital ichthyosis, unspecified: Secondary | ICD-10-CM | POA: Diagnosis not present

## 2023-06-19 DIAGNOSIS — R627 Adult failure to thrive: Secondary | ICD-10-CM | POA: Diagnosis not present

## 2023-06-19 DIAGNOSIS — R634 Abnormal weight loss: Secondary | ICD-10-CM | POA: Diagnosis not present

## 2023-06-19 DIAGNOSIS — Z713 Dietary counseling and surveillance: Secondary | ICD-10-CM | POA: Diagnosis not present

## 2023-06-19 DIAGNOSIS — M542 Cervicalgia: Secondary | ICD-10-CM | POA: Diagnosis not present

## 2023-06-19 DIAGNOSIS — Z981 Arthrodesis status: Secondary | ICD-10-CM | POA: Diagnosis not present

## 2023-06-23 DIAGNOSIS — E43 Unspecified severe protein-calorie malnutrition: Secondary | ICD-10-CM | POA: Diagnosis not present

## 2023-06-23 DIAGNOSIS — Z9089 Acquired absence of other organs: Secondary | ICD-10-CM | POA: Diagnosis not present

## 2023-06-23 DIAGNOSIS — Z833 Family history of diabetes mellitus: Secondary | ICD-10-CM | POA: Diagnosis not present

## 2023-06-23 DIAGNOSIS — Z681 Body mass index (BMI) 19 or less, adult: Secondary | ICD-10-CM | POA: Diagnosis not present

## 2023-06-23 DIAGNOSIS — Z825 Family history of asthma and other chronic lower respiratory diseases: Secondary | ICD-10-CM | POA: Diagnosis not present

## 2023-06-23 DIAGNOSIS — R634 Abnormal weight loss: Secondary | ICD-10-CM | POA: Diagnosis not present

## 2023-06-23 DIAGNOSIS — J439 Emphysema, unspecified: Secondary | ICD-10-CM | POA: Diagnosis not present

## 2023-06-23 DIAGNOSIS — E119 Type 2 diabetes mellitus without complications: Secondary | ICD-10-CM | POA: Diagnosis not present

## 2023-06-23 DIAGNOSIS — M545 Low back pain, unspecified: Secondary | ICD-10-CM | POA: Diagnosis not present

## 2023-06-23 DIAGNOSIS — Z961 Presence of intraocular lens: Secondary | ICD-10-CM | POA: Diagnosis not present

## 2023-06-23 DIAGNOSIS — R627 Adult failure to thrive: Secondary | ICD-10-CM | POA: Diagnosis not present

## 2023-06-23 DIAGNOSIS — Z87891 Personal history of nicotine dependence: Secondary | ICD-10-CM | POA: Diagnosis not present

## 2023-06-23 DIAGNOSIS — Z79891 Long term (current) use of opiate analgesic: Secondary | ICD-10-CM | POA: Diagnosis not present

## 2023-06-23 DIAGNOSIS — G8929 Other chronic pain: Secondary | ICD-10-CM | POA: Diagnosis not present

## 2023-06-23 DIAGNOSIS — D539 Nutritional anemia, unspecified: Secondary | ICD-10-CM | POA: Diagnosis not present

## 2023-06-23 DIAGNOSIS — Z79899 Other long term (current) drug therapy: Secondary | ICD-10-CM | POA: Diagnosis not present

## 2023-06-24 DIAGNOSIS — Z79899 Other long term (current) drug therapy: Secondary | ICD-10-CM | POA: Diagnosis not present

## 2023-06-24 DIAGNOSIS — R634 Abnormal weight loss: Secondary | ICD-10-CM | POA: Diagnosis not present

## 2023-06-24 DIAGNOSIS — E119 Type 2 diabetes mellitus without complications: Secondary | ICD-10-CM | POA: Diagnosis not present

## 2023-06-24 DIAGNOSIS — Z825 Family history of asthma and other chronic lower respiratory diseases: Secondary | ICD-10-CM | POA: Diagnosis not present

## 2023-06-24 DIAGNOSIS — D539 Nutritional anemia, unspecified: Secondary | ICD-10-CM | POA: Diagnosis not present

## 2023-06-24 DIAGNOSIS — R627 Adult failure to thrive: Secondary | ICD-10-CM | POA: Diagnosis not present

## 2023-06-24 DIAGNOSIS — G8929 Other chronic pain: Secondary | ICD-10-CM | POA: Diagnosis not present

## 2023-06-24 DIAGNOSIS — E43 Unspecified severe protein-calorie malnutrition: Secondary | ICD-10-CM | POA: Diagnosis not present

## 2023-06-24 DIAGNOSIS — Z87891 Personal history of nicotine dependence: Secondary | ICD-10-CM | POA: Diagnosis not present

## 2023-06-24 DIAGNOSIS — M545 Low back pain, unspecified: Secondary | ICD-10-CM | POA: Diagnosis not present

## 2023-06-24 DIAGNOSIS — Z961 Presence of intraocular lens: Secondary | ICD-10-CM | POA: Diagnosis not present

## 2023-06-24 DIAGNOSIS — Z681 Body mass index (BMI) 19 or less, adult: Secondary | ICD-10-CM | POA: Diagnosis not present

## 2023-06-24 DIAGNOSIS — Z9089 Acquired absence of other organs: Secondary | ICD-10-CM | POA: Diagnosis not present

## 2023-06-24 DIAGNOSIS — Z79891 Long term (current) use of opiate analgesic: Secondary | ICD-10-CM | POA: Diagnosis not present

## 2023-06-24 DIAGNOSIS — J439 Emphysema, unspecified: Secondary | ICD-10-CM | POA: Diagnosis not present

## 2023-06-24 DIAGNOSIS — Z833 Family history of diabetes mellitus: Secondary | ICD-10-CM | POA: Diagnosis not present

## 2023-06-25 DIAGNOSIS — E119 Type 2 diabetes mellitus without complications: Secondary | ICD-10-CM | POA: Diagnosis not present

## 2023-06-25 DIAGNOSIS — G8929 Other chronic pain: Secondary | ICD-10-CM | POA: Diagnosis not present

## 2023-06-25 DIAGNOSIS — Z79891 Long term (current) use of opiate analgesic: Secondary | ICD-10-CM | POA: Diagnosis not present

## 2023-06-25 DIAGNOSIS — Z833 Family history of diabetes mellitus: Secondary | ICD-10-CM | POA: Diagnosis not present

## 2023-06-25 DIAGNOSIS — Z825 Family history of asthma and other chronic lower respiratory diseases: Secondary | ICD-10-CM | POA: Diagnosis not present

## 2023-06-25 DIAGNOSIS — E43 Unspecified severe protein-calorie malnutrition: Secondary | ICD-10-CM | POA: Diagnosis not present

## 2023-06-25 DIAGNOSIS — D539 Nutritional anemia, unspecified: Secondary | ICD-10-CM | POA: Diagnosis not present

## 2023-06-25 DIAGNOSIS — Z961 Presence of intraocular lens: Secondary | ICD-10-CM | POA: Diagnosis not present

## 2023-06-25 DIAGNOSIS — M545 Low back pain, unspecified: Secondary | ICD-10-CM | POA: Diagnosis not present

## 2023-06-25 DIAGNOSIS — R627 Adult failure to thrive: Secondary | ICD-10-CM | POA: Diagnosis not present

## 2023-06-25 DIAGNOSIS — J439 Emphysema, unspecified: Secondary | ICD-10-CM | POA: Diagnosis not present

## 2023-06-25 DIAGNOSIS — Z87891 Personal history of nicotine dependence: Secondary | ICD-10-CM | POA: Diagnosis not present

## 2023-06-25 DIAGNOSIS — Z9089 Acquired absence of other organs: Secondary | ICD-10-CM | POA: Diagnosis not present

## 2023-06-25 DIAGNOSIS — R634 Abnormal weight loss: Secondary | ICD-10-CM | POA: Diagnosis not present

## 2023-06-25 DIAGNOSIS — Z79899 Other long term (current) drug therapy: Secondary | ICD-10-CM | POA: Diagnosis not present

## 2023-06-25 DIAGNOSIS — R1310 Dysphagia, unspecified: Secondary | ICD-10-CM | POA: Diagnosis not present

## 2023-06-25 DIAGNOSIS — Z681 Body mass index (BMI) 19 or less, adult: Secondary | ICD-10-CM | POA: Diagnosis not present

## 2023-06-26 DIAGNOSIS — Z79891 Long term (current) use of opiate analgesic: Secondary | ICD-10-CM | POA: Diagnosis not present

## 2023-06-26 DIAGNOSIS — R627 Adult failure to thrive: Secondary | ICD-10-CM | POA: Diagnosis not present

## 2023-06-26 DIAGNOSIS — Z681 Body mass index (BMI) 19 or less, adult: Secondary | ICD-10-CM | POA: Diagnosis not present

## 2023-06-26 DIAGNOSIS — E43 Unspecified severe protein-calorie malnutrition: Secondary | ICD-10-CM | POA: Diagnosis not present

## 2023-06-26 DIAGNOSIS — D539 Nutritional anemia, unspecified: Secondary | ICD-10-CM | POA: Diagnosis not present

## 2023-06-26 DIAGNOSIS — J439 Emphysema, unspecified: Secondary | ICD-10-CM | POA: Diagnosis not present

## 2023-06-26 DIAGNOSIS — Z79899 Other long term (current) drug therapy: Secondary | ICD-10-CM | POA: Diagnosis not present

## 2023-06-26 DIAGNOSIS — Z825 Family history of asthma and other chronic lower respiratory diseases: Secondary | ICD-10-CM | POA: Diagnosis not present

## 2023-06-26 DIAGNOSIS — Z87891 Personal history of nicotine dependence: Secondary | ICD-10-CM | POA: Diagnosis not present

## 2023-06-26 DIAGNOSIS — R634 Abnormal weight loss: Secondary | ICD-10-CM | POA: Diagnosis not present

## 2023-06-26 DIAGNOSIS — Z961 Presence of intraocular lens: Secondary | ICD-10-CM | POA: Diagnosis not present

## 2023-06-26 DIAGNOSIS — G8929 Other chronic pain: Secondary | ICD-10-CM | POA: Diagnosis not present

## 2023-06-26 DIAGNOSIS — Z833 Family history of diabetes mellitus: Secondary | ICD-10-CM | POA: Diagnosis not present

## 2023-06-26 DIAGNOSIS — Z9089 Acquired absence of other organs: Secondary | ICD-10-CM | POA: Diagnosis not present

## 2023-06-26 DIAGNOSIS — M545 Low back pain, unspecified: Secondary | ICD-10-CM | POA: Diagnosis not present

## 2023-06-26 DIAGNOSIS — E119 Type 2 diabetes mellitus without complications: Secondary | ICD-10-CM | POA: Diagnosis not present

## 2023-07-01 DIAGNOSIS — E43 Unspecified severe protein-calorie malnutrition: Secondary | ICD-10-CM | POA: Diagnosis not present

## 2023-07-01 DIAGNOSIS — R1310 Dysphagia, unspecified: Secondary | ICD-10-CM | POA: Diagnosis not present

## 2023-07-05 DIAGNOSIS — R1310 Dysphagia, unspecified: Secondary | ICD-10-CM | POA: Diagnosis not present

## 2023-07-05 DIAGNOSIS — E43 Unspecified severe protein-calorie malnutrition: Secondary | ICD-10-CM | POA: Diagnosis not present

## 2023-07-08 DIAGNOSIS — I739 Peripheral vascular disease, unspecified: Secondary | ICD-10-CM | POA: Diagnosis not present

## 2023-07-08 DIAGNOSIS — M79675 Pain in left toe(s): Secondary | ICD-10-CM | POA: Diagnosis not present

## 2023-07-08 DIAGNOSIS — M79672 Pain in left foot: Secondary | ICD-10-CM | POA: Diagnosis not present

## 2023-07-08 DIAGNOSIS — L110 Acquired keratosis follicularis: Secondary | ICD-10-CM | POA: Diagnosis not present

## 2023-07-08 DIAGNOSIS — M79671 Pain in right foot: Secondary | ICD-10-CM | POA: Diagnosis not present

## 2023-07-08 DIAGNOSIS — M79674 Pain in right toe(s): Secondary | ICD-10-CM | POA: Diagnosis not present

## 2023-07-10 DIAGNOSIS — E43 Unspecified severe protein-calorie malnutrition: Secondary | ICD-10-CM | POA: Diagnosis not present

## 2023-07-10 DIAGNOSIS — R1310 Dysphagia, unspecified: Secondary | ICD-10-CM | POA: Diagnosis not present

## 2023-07-14 DIAGNOSIS — M5451 Vertebrogenic low back pain: Secondary | ICD-10-CM | POA: Diagnosis not present

## 2023-07-14 DIAGNOSIS — Z79891 Long term (current) use of opiate analgesic: Secondary | ICD-10-CM | POA: Diagnosis not present

## 2023-07-14 DIAGNOSIS — G894 Chronic pain syndrome: Secondary | ICD-10-CM | POA: Diagnosis not present

## 2023-07-14 DIAGNOSIS — M542 Cervicalgia: Secondary | ICD-10-CM | POA: Diagnosis not present

## 2023-07-15 DIAGNOSIS — E43 Unspecified severe protein-calorie malnutrition: Secondary | ICD-10-CM | POA: Diagnosis not present

## 2023-07-15 DIAGNOSIS — R1310 Dysphagia, unspecified: Secondary | ICD-10-CM | POA: Diagnosis not present

## 2023-07-20 DIAGNOSIS — R1310 Dysphagia, unspecified: Secondary | ICD-10-CM | POA: Diagnosis not present

## 2023-07-20 DIAGNOSIS — E43 Unspecified severe protein-calorie malnutrition: Secondary | ICD-10-CM | POA: Diagnosis not present

## 2023-07-23 DIAGNOSIS — R932 Abnormal findings on diagnostic imaging of liver and biliary tract: Secondary | ICD-10-CM | POA: Diagnosis not present

## 2023-07-23 DIAGNOSIS — K7689 Other specified diseases of liver: Secondary | ICD-10-CM | POA: Diagnosis not present

## 2023-07-23 DIAGNOSIS — R638 Other symptoms and signs concerning food and fluid intake: Secondary | ICD-10-CM | POA: Diagnosis not present

## 2023-07-23 DIAGNOSIS — K7400 Hepatic fibrosis, unspecified: Secondary | ICD-10-CM | POA: Diagnosis not present

## 2023-07-23 DIAGNOSIS — K7402 Hepatic fibrosis, advanced fibrosis: Secondary | ICD-10-CM | POA: Diagnosis not present

## 2023-07-29 DIAGNOSIS — R64 Cachexia: Secondary | ICD-10-CM | POA: Diagnosis not present

## 2023-07-29 DIAGNOSIS — Z931 Gastrostomy status: Secondary | ICD-10-CM | POA: Diagnosis not present

## 2023-08-05 DIAGNOSIS — E43 Unspecified severe protein-calorie malnutrition: Secondary | ICD-10-CM | POA: Diagnosis not present

## 2023-08-05 DIAGNOSIS — R1310 Dysphagia, unspecified: Secondary | ICD-10-CM | POA: Diagnosis not present

## 2023-08-11 DIAGNOSIS — G894 Chronic pain syndrome: Secondary | ICD-10-CM | POA: Diagnosis not present

## 2023-08-11 DIAGNOSIS — M542 Cervicalgia: Secondary | ICD-10-CM | POA: Diagnosis not present

## 2023-08-11 DIAGNOSIS — Z79891 Long term (current) use of opiate analgesic: Secondary | ICD-10-CM | POA: Diagnosis not present

## 2023-08-11 DIAGNOSIS — M5451 Vertebrogenic low back pain: Secondary | ICD-10-CM | POA: Diagnosis not present

## 2023-08-22 ENCOUNTER — Telehealth: Payer: Self-pay

## 2023-08-22 DIAGNOSIS — Z79899 Other long term (current) drug therapy: Secondary | ICD-10-CM

## 2023-08-22 NOTE — Progress Notes (Signed)
   08/22/2023  Patient ID: Martin White, male   DOB: 04-06-1955, 68 y.o.   MRN: [id number]  Medication Adherence and Access (MAH/MAD): [facility]: Lisinopril 20 mg was last filled on 03/28/2023 for a 100-day supply, per Dr. Anson Basta. Patient was last seen in the clinic on 01/24/2023 and PCP noted for patient to continue the medication. However, I do not see any upcoming appointments with a provider at the clinic.  I called patient he was occupied and stated that he would call me back later today. Will verify if patient is attributed to practice.   MAD: No diabetes medication is currently listed in the chart, despite a diagnosis of type 2 diabetes. The patient was previously on metformin. The most recent hemoglobin A1c was 6.7% as of April 2024.   Alexandria Angel, PharmD Clinical Pharmacist Cell: [phone]

## 2023-08-28 DIAGNOSIS — Z931 Gastrostomy status: Secondary | ICD-10-CM | POA: Diagnosis not present

## 2023-08-28 DIAGNOSIS — R64 Cachexia: Secondary | ICD-10-CM | POA: Diagnosis not present

## 2023-08-28 DIAGNOSIS — J432 Centrilobular emphysema: Secondary | ICD-10-CM | POA: Diagnosis not present

## 2023-08-31 DIAGNOSIS — E43 Unspecified severe protein-calorie malnutrition: Secondary | ICD-10-CM | POA: Diagnosis not present

## 2023-08-31 DIAGNOSIS — R1310 Dysphagia, unspecified: Secondary | ICD-10-CM | POA: Diagnosis not present

## 2023-09-04 DIAGNOSIS — R1310 Dysphagia, unspecified: Secondary | ICD-10-CM | POA: Diagnosis not present

## 2023-09-04 DIAGNOSIS — E43 Unspecified severe protein-calorie malnutrition: Secondary | ICD-10-CM | POA: Diagnosis not present

## 2023-09-09 DIAGNOSIS — M5451 Vertebrogenic low back pain: Secondary | ICD-10-CM | POA: Diagnosis not present

## 2023-09-09 DIAGNOSIS — G894 Chronic pain syndrome: Secondary | ICD-10-CM | POA: Diagnosis not present

## 2023-09-09 DIAGNOSIS — Z79891 Long term (current) use of opiate analgesic: Secondary | ICD-10-CM | POA: Diagnosis not present

## 2023-09-09 DIAGNOSIS — M542 Cervicalgia: Secondary | ICD-10-CM | POA: Diagnosis not present

## 2023-09-12 DIAGNOSIS — R1310 Dysphagia, unspecified: Secondary | ICD-10-CM | POA: Diagnosis not present

## 2023-09-12 DIAGNOSIS — E43 Unspecified severe protein-calorie malnutrition: Secondary | ICD-10-CM | POA: Diagnosis not present

## 2023-09-16 DIAGNOSIS — I739 Peripheral vascular disease, unspecified: Secondary | ICD-10-CM | POA: Diagnosis not present

## 2023-09-16 DIAGNOSIS — M79675 Pain in left toe(s): Secondary | ICD-10-CM | POA: Diagnosis not present

## 2023-09-16 DIAGNOSIS — M79671 Pain in right foot: Secondary | ICD-10-CM | POA: Diagnosis not present

## 2023-09-16 DIAGNOSIS — M79674 Pain in right toe(s): Secondary | ICD-10-CM | POA: Diagnosis not present

## 2023-09-16 DIAGNOSIS — L110 Acquired keratosis follicularis: Secondary | ICD-10-CM | POA: Diagnosis not present

## 2023-09-16 DIAGNOSIS — M79672 Pain in left foot: Secondary | ICD-10-CM | POA: Diagnosis not present

## 2023-09-20 DIAGNOSIS — R1310 Dysphagia, unspecified: Secondary | ICD-10-CM | POA: Diagnosis not present

## 2023-09-20 DIAGNOSIS — E43 Unspecified severe protein-calorie malnutrition: Secondary | ICD-10-CM | POA: Diagnosis not present

## 2023-09-28 DIAGNOSIS — E43 Unspecified severe protein-calorie malnutrition: Secondary | ICD-10-CM | POA: Diagnosis not present

## 2023-09-28 DIAGNOSIS — R1310 Dysphagia, unspecified: Secondary | ICD-10-CM | POA: Diagnosis not present

## 2023-09-30 DIAGNOSIS — R1310 Dysphagia, unspecified: Secondary | ICD-10-CM | POA: Diagnosis not present

## 2023-09-30 DIAGNOSIS — E43 Unspecified severe protein-calorie malnutrition: Secondary | ICD-10-CM | POA: Diagnosis not present

## 2023-10-02 DIAGNOSIS — Z931 Gastrostomy status: Secondary | ICD-10-CM | POA: Diagnosis not present

## 2023-10-02 DIAGNOSIS — R627 Adult failure to thrive: Secondary | ICD-10-CM | POA: Diagnosis not present

## 2023-10-02 DIAGNOSIS — Z09 Encounter for follow-up examination after completed treatment for conditions other than malignant neoplasm: Secondary | ICD-10-CM | POA: Diagnosis not present

## 2023-10-07 DIAGNOSIS — Z79891 Long term (current) use of opiate analgesic: Secondary | ICD-10-CM | POA: Diagnosis not present

## 2023-10-14 DIAGNOSIS — M5451 Vertebrogenic low back pain: Secondary | ICD-10-CM | POA: Diagnosis not present

## 2023-10-14 DIAGNOSIS — M542 Cervicalgia: Secondary | ICD-10-CM | POA: Diagnosis not present

## 2023-10-14 DIAGNOSIS — Z79891 Long term (current) use of opiate analgesic: Secondary | ICD-10-CM | POA: Diagnosis not present

## 2023-10-14 DIAGNOSIS — G894 Chronic pain syndrome: Secondary | ICD-10-CM | POA: Diagnosis not present

## 2023-10-23 DIAGNOSIS — J432 Centrilobular emphysema: Secondary | ICD-10-CM | POA: Diagnosis not present

## 2023-10-23 DIAGNOSIS — Z23 Encounter for immunization: Secondary | ICD-10-CM | POA: Diagnosis not present

## 2023-10-23 DIAGNOSIS — J439 Emphysema, unspecified: Secondary | ICD-10-CM | POA: Diagnosis not present

## 2023-10-23 DIAGNOSIS — K7460 Unspecified cirrhosis of liver: Secondary | ICD-10-CM | POA: Diagnosis not present

## 2023-11-05 DIAGNOSIS — R64 Cachexia: Secondary | ICD-10-CM | POA: Diagnosis not present

## 2023-11-05 DIAGNOSIS — J432 Centrilobular emphysema: Secondary | ICD-10-CM | POA: Diagnosis not present

## 2023-11-05 DIAGNOSIS — K7460 Unspecified cirrhosis of liver: Secondary | ICD-10-CM | POA: Diagnosis not present

## 2023-11-05 DIAGNOSIS — Z931 Gastrostomy status: Secondary | ICD-10-CM | POA: Diagnosis not present

## 2023-11-05 DIAGNOSIS — G8929 Other chronic pain: Secondary | ICD-10-CM | POA: Diagnosis not present

## 2023-11-05 DIAGNOSIS — R252 Cramp and spasm: Secondary | ICD-10-CM | POA: Diagnosis not present

## 2023-11-05 DIAGNOSIS — M545 Low back pain, unspecified: Secondary | ICD-10-CM | POA: Diagnosis not present

## 2023-11-11 DIAGNOSIS — E43 Unspecified severe protein-calorie malnutrition: Secondary | ICD-10-CM | POA: Diagnosis not present

## 2023-11-11 DIAGNOSIS — G894 Chronic pain syndrome: Secondary | ICD-10-CM | POA: Diagnosis not present

## 2023-11-11 DIAGNOSIS — Z79891 Long term (current) use of opiate analgesic: Secondary | ICD-10-CM | POA: Diagnosis not present

## 2023-11-11 DIAGNOSIS — M542 Cervicalgia: Secondary | ICD-10-CM | POA: Diagnosis not present

## 2023-11-11 DIAGNOSIS — R1310 Dysphagia, unspecified: Secondary | ICD-10-CM | POA: Diagnosis not present

## 2023-11-11 DIAGNOSIS — M5451 Vertebrogenic low back pain: Secondary | ICD-10-CM | POA: Diagnosis not present

## 2023-11-16 DIAGNOSIS — R1310 Dysphagia, unspecified: Secondary | ICD-10-CM | POA: Diagnosis not present

## 2023-11-21 DIAGNOSIS — R1310 Dysphagia, unspecified: Secondary | ICD-10-CM | POA: Diagnosis not present

## 2023-11-21 DIAGNOSIS — E43 Unspecified severe protein-calorie malnutrition: Secondary | ICD-10-CM | POA: Diagnosis not present

## 2023-11-25 DIAGNOSIS — L110 Acquired keratosis follicularis: Secondary | ICD-10-CM | POA: Diagnosis not present

## 2023-11-25 DIAGNOSIS — M79674 Pain in right toe(s): Secondary | ICD-10-CM | POA: Diagnosis not present

## 2023-11-25 DIAGNOSIS — M79671 Pain in right foot: Secondary | ICD-10-CM | POA: Diagnosis not present

## 2023-11-25 DIAGNOSIS — M79672 Pain in left foot: Secondary | ICD-10-CM | POA: Diagnosis not present

## 2023-11-25 DIAGNOSIS — M79675 Pain in left toe(s): Secondary | ICD-10-CM | POA: Diagnosis not present

## 2023-11-25 DIAGNOSIS — I739 Peripheral vascular disease, unspecified: Secondary | ICD-10-CM | POA: Diagnosis not present

## 2023-11-26 DIAGNOSIS — E43 Unspecified severe protein-calorie malnutrition: Secondary | ICD-10-CM | POA: Diagnosis not present

## 2023-11-26 DIAGNOSIS — K7460 Unspecified cirrhosis of liver: Secondary | ICD-10-CM | POA: Diagnosis not present

## 2023-11-26 DIAGNOSIS — R1310 Dysphagia, unspecified: Secondary | ICD-10-CM | POA: Diagnosis not present

## 2023-11-26 DIAGNOSIS — Z23 Encounter for immunization: Secondary | ICD-10-CM | POA: Diagnosis not present

## 2024-01-29 NOTE — Progress Notes (Signed)
 Population Health Department [facility] Worker Note  Contact made with: Patient Contacts     Contact Date/Time Type Contact Phone/Fax   01/29/2024 04:37 PM EDT In Person (Incoming) [name], Martin Sieving (Self)       CHW Visit indicated: Yes  Home Visit Appointment Scheduled: 01/29/2024 WEIM at 4:37 PM,   Care Coordination made with : Community/  [facility] DSS  Notes:   CHW met patient at [facility] lobby to obtain signature for a Medicaid application Patient states he did not have the correct proof of income Patient states he will go to the bank on 01/30/2024 to obtain bank statements and contact me with an update CHW will reach back out  Electronically signed by: Rolin GORMAN Serge, CHWorker 01/29/2024 4:59 PM

## 2024-02-02 ENCOUNTER — Encounter (HOSPITAL_COMMUNITY)
Admission: RE | Admit: 2024-02-02 | Discharge: 2024-02-02 | Disposition: A | Discharge status: Home / Self Care | Source: Ambulatory Visit | Attending: Internal Medicine | Admitting: Internal Medicine

## 2024-02-02 DIAGNOSIS — J432 Centrilobular emphysema: Secondary | ICD-10-CM | POA: Insufficient documentation

## 2024-02-02 NOTE — Progress Notes (Signed)
 Completed virtual orientation today.  EP evaluation is scheduled for 02/09/24 at 1:00 .  Documentation for diagnosis can be found in [facility] encounter care everywhere, [facility].

## 2024-02-03 NOTE — Progress Notes (Signed)
 Population Health Department [facility] Worker Note  Contact made with: Patient Contacts     Contact Date/Time Type Contact Phone/Fax   02/03/2024 01:37 PM EST Phone (Outgoing) Maitland, [place] (Self) [phone] (H)   Successfully contacted patient       CHW Visit indicated: No  Home Visit Appointment Scheduled: No  Care Coordination made with : Community/  [facility] DSS  Notes:   CHW called [facility] DSS and spoke with April April advised CHW to send the patients Medicaid application via email to Brook Doolin at [email or web address] CHW sent the application via email on 02/03/2024  CHW called patient and provided contact information for [facility] DSS and advised him to follow up with their office regarding his application Patient verbalized understanding CHW will reach back out  Electronically signed by: Rolin GORMAN Serge, CHWorker 02/03/2024 1:48 PM

## 2024-02-06 NOTE — Progress Notes (Signed)
 Population Health Department [facility] Worker Note  Contact made with: Patient Contacts     Contact Date/Time Type Contact Phone/Fax   02/06/2024 08:56 AM EST Phone (Outgoing) Jamauri, Kruzel (Self) [phone] (H)   Left Message       CHW Visit indicated: No  Home Visit Appointment Scheduled: No  Notes:   CHW called patient and left a voicemail with my contact information requesting a return call CHW will reach back out   Electronically signed by: Rolin GORMAN Serge, CHWorker 02/06/2024 8:58 AM

## 2024-02-09 ENCOUNTER — Encounter (HOSPITAL_COMMUNITY)
Admission: RE | Admit: 2024-02-09 | Discharge: 2024-02-09 | Disposition: A | Discharge status: Home / Self Care | Source: Ambulatory Visit | Attending: Internal Medicine | Admitting: Internal Medicine

## 2024-02-09 VITALS — Ht 67.0 in | Wt 124.3 lb

## 2024-02-09 DIAGNOSIS — J432 Centrilobular emphysema: Secondary | ICD-10-CM | POA: Diagnosis present

## 2024-02-09 NOTE — Progress Notes (Signed)
 Pulmonary Individual Treatment Plan  Patient Details  Name: Martin White MRN: [id number] Date of Birth: July 12, 1955 Referring Provider:   Flowsheet Row PULMONARY REHAB OTHER RESP ORIENTATION from 02/09/2024 in [facility]  Referring Provider Erlene Gearing MD    Initial Encounter Date:  Flowsheet Row PULMONARY REHAB OTHER RESP ORIENTATION from 02/09/2024 in [facility]  Date 02/09/24    Visit Diagnosis: Centrilobular emphysema (HCC)  Patient's Home Medications on Admission:   Current Outpatient Medications:    albuterol (ACCUNEB) 0.63 MG/3ML nebulizer solution, Inhale 0.63 mg into the lungs., Disp: , Rfl:    albuterol (ACCUNEB) 1.25 MG/3ML nebulizer solution, Inhale 1.25 mg into the lungs., Disp: , Rfl:    albuterol (VENTOLIN HFA) 108 (90 Base) MCG/ACT inhaler, Inhale 2 puffs into the lungs every 6 (six) hours as needed., Disp: , Rfl:    ammonium lactate (AMLACTIN) 12 % cream, Apply 1 Application topically as needed., Disp: , Rfl:    ascorbic acid (VITAMIN C) 1000 MG tablet, Take 1,000 mg by mouth daily., Disp: , Rfl:    B Complex Vitamins (VITAMIN B-COMPLEX) TABS, Take 1 tablet by mouth daily., Disp: , Rfl:    Carboxymethylcellulose Sod PF 1 % GEL, 1 drop., Disp: , Rfl:    Cholecalciferol 125 MCG (5000 UT) TABS, Take 5,000 Units by mouth., Disp: , Rfl:    diclofenac Sodium (VOLTAREN) 1 % GEL, Apply 2 g topically 4 (four) times daily., Disp: , Rfl:    gabapentin (NEURONTIN) 600 MG tablet, Take 600 mg by mouth as needed., Disp: , Rfl:    ibuprofen (ADVIL) 200 MG tablet, Take 800 mg by mouth., Disp: , Rfl:    megestrol (MEGACE) 20 MG tablet, Take 40 mg by mouth 4 (four) times daily., Disp: , Rfl:    mirtazapine (REMERON) 7.5 MG tablet, Take 7.5 mg by mouth., Disp: , Rfl:    olopatadine (PATANOL) 0.1 % ophthalmic solution, Apply 1 drop to eye., Disp: , Rfl:    Olopatadine HCl 0.2 % SOLN, 1 drop., Disp: , Rfl:    omega-3 fish oil (MAXEPA)  1000 MG CAPS capsule, Take 1 capsule by mouth daily., Disp: , Rfl:    oxyCODONE  (ROXICODONE ) 15 MG immediate release tablet, Take 15 mg by mouth every 6 (six) hours as needed., Disp: , Rfl:    potassium chloride SA (KLOR-CON M) 20 MEQ tablet, Take 20 mEq by mouth 2 (two) times daily., Disp: , Rfl:    TRELEGY ELLIPTA 100-62.5-25 MCG/ACT AEPB, Inhale 1 puff into the lungs daily., Disp: , Rfl:   Past Medical History: Past Medical History:  Diagnosis Date   COPD (chronic obstructive pulmonary disease) (HCC)    Degenerative cervical disc    Diabetes mellitus without complication (HCC)    Legally blind     Tobacco Use: Social History   Tobacco Use  Smoking Status Some Days  Smokeless Tobacco Never    Labs: Review Flowsheet        No data to display          Capillary Blood Glucose: No results found for: GLUCAP   Pulmonary Assessment Scores:  Pulmonary Assessment Scores     Row Name 02/09/24 1509         ADL UCSD   ADL Phase Entry       mMRC Score   mMRC Score 4       UCSD: Self-administered rating of dyspnea associated with activities of daily living (ADLs) 6-point scale (0 = not  at all to 5 = maximal or unable to do because of breathlessness)  Scoring Scores range from 0 to 120.  Minimally important difference is 5 units  CAT: CAT can identify the health impairment of COPD patients and is better correlated with disease progression.  CAT has a scoring range of zero to 40. The CAT score is classified into four groups of low (less than 10), medium (10 - 20), high (21-30) and very high (31-40) based on the impact level of disease on health status. A CAT score over 10 suggests significant symptoms.  A worsening CAT score could be explained by an exacerbation, poor medication adherence, poor inhaler technique, or progression of COPD or comorbid conditions.  CAT MCID is 2 points  mMRC: mMRC (Modified Medical Research Council) Dyspnea Scale is used to assess the  degree of baseline functional disability in patients of respiratory disease due to dyspnea. No minimal important difference is established. A decrease in score of 1 point or greater is considered a positive change.   Pulmonary Function Assessment:   Exercise Target Goals: Exercise Program Goal: Individual exercise prescription set using results from initial 6 min walk test and THRR while considering  patient's activity barriers and safety.   Exercise Prescription Goal: Initial exercise prescription builds to 30-45 minutes a day of aerobic activity, 2-3 days per week.  Home exercise guidelines will be given to patient during program as part of exercise prescription that the participant will acknowledge.  Activity Barriers & Risk Stratification:  Activity Barriers & Cardiac Risk Stratification - 02/02/24 1308       Activity Barriers & Cardiac Risk Stratification   Activity Barriers History of Falls;Shortness of Breath;Neck/Spine Problems;Deconditioning;Balance Concerns;Other (comment)    Comments Chronic pain, mostly in back          6 Minute Walk:  6 Minute Walk     Row Name 02/09/24 1451         6 Minute Walk   Phase Initial     Distance 890 feet     Walk Time 6 minutes     # of Rest Breaks 0     MPH 1.68     METS 2.79     RPE 17     Perceived Dyspnea  3     VO2 Peak 9.78     Symptoms Yes (comment)     Comments chronic back and hip pain 10/10, R leg numbness     Resting HR 75 bpm     Resting BP 116/58     Resting Oxygen Saturation  98 %     Exercise Oxygen Saturation  during 6 min walk 97 %     Max Ex. HR 98 bpm     Max Ex. BP 146/74     2 Minute Post BP 132/60       Interval HR   1 Minute HR 95     2 Minute HR 92     3 Minute HR 94     4 Minute HR 94     5 Minute HR 98     6 Minute HR 90     2 Minute Post HR 71     Interval Heart Rate? Yes       Interval Oxygen   Interval Oxygen? Yes     Baseline Oxygen Saturation % 98 %     1 Minute Oxygen Saturation  % 97 %     1 Minute Liters of Oxygen 0 L  Room Air     2 Minute Oxygen Saturation % 98 %     2 Minute Liters of Oxygen 0 L     3 Minute Oxygen Saturation % 98 %     3 Minute Liters of Oxygen 0 L     4 Minute Oxygen Saturation % 99 %     4 Minute Liters of Oxygen 0 L     5 Minute Oxygen Saturation % 100 %     5 Minute Liters of Oxygen 0 L     6 Minute Oxygen Saturation % 98 %     6 Minute Liters of Oxygen 0 L     2 Minute Post Oxygen Saturation % 100 %     2 Minute Post Liters of Oxygen 0 L        Oxygen Initial Assessment:  Oxygen Initial Assessment - 02/09/24 1509       Home Oxygen   Home Oxygen Device None    Sleep Oxygen Prescription None    Home Exercise Oxygen Prescription None    Home Resting Oxygen Prescription None      Initial 6 min Walk   Oxygen Used None      Program Oxygen Prescription   Program Oxygen Prescription None      Intervention   Short Term Goals To learn and understand importance of maintaining oxygen saturations>88%;To learn and demonstrate proper pursed lip breathing techniques or other breathing techniques. ;To learn and demonstrate proper use of respiratory medications;To learn and understand importance of monitoring SPO2 with pulse oximeter and demonstrate accurate use of the pulse oximeter.    Long  Term Goals Exhibits proper breathing techniques, such as pursed lip breathing or other method taught during program session;Compliance with respiratory medication;Demonstrates proper use of MDI's;Maintenance of O2 saturations>88%;Verbalizes importance of monitoring SPO2 with pulse oximeter and return demonstration          Oxygen Re-Evaluation:   Oxygen Discharge (Final Oxygen Re-Evaluation):   Initial Exercise Prescription:  Initial Exercise Prescription - 02/09/24 1400       Date of Initial Exercise RX and Referring Provider   Date 02/09/24    Referring Provider Erlene Gearing MD      Oxygen   Maintain Oxygen Saturation 88% or higher       Treadmill   MPH 0.8    Grade 0    Minutes 15    METs 1.6      NuStep   Level 1    SPM 80    Minutes 15    METs 1.5      REL-XR   Level 1    Speed 50    Minutes 15    METs 1.5      Prescription Details   Frequency (times per week) 2    Duration Progress to 30 minutes of continuous aerobic without signs/symptoms of physical distress      Intensity   THRR 40-80% of Max Heartrate 105-136    Ratings of Perceived Exertion 11-13    Perceived Dyspnea 0-4      Progression   Progression Continue to progress workloads to maintain intensity without signs/symptoms of physical distress.      Resistance Training   Training Prescription Yes    Weight 4 lb    Reps 10-15          Perform Capillary Blood Glucose checks as needed.  Exercise Prescription Changes:   Exercise Prescription Changes     Row Name 02/09/24  1400             Response to Exercise   Blood Pressure (Admit) 116/58       Blood Pressure (Exercise) 146/74       Blood Pressure (Exit) 118/66       Heart Rate (Admit) 75 bpm       Heart Rate (Exercise) 98 bpm       Heart Rate (Exit) 72 bpm       Oxygen Saturation (Admit) 98 %       Oxygen Saturation (Exercise) 97 %       Oxygen Saturation (Exit) 100 %       Rating of Perceived Exertion (Exercise) 17       Perceived Dyspnea (Exercise) 3       Symptoms chronic back and hip pain 10/10, R leg numbness       Comments walk test results          Exercise Comments:   Exercise Comments     Row Name 02/02/24 1329           Exercise Comments Rollyn currently doesn't do any exercise at home. He does his ADL's but that's it.          Exercise Goals and Review:   Exercise Goals     Row Name 02/02/24 1329 02/09/24 1456           Exercise Goals   Increase Physical Activity Yes Yes      Intervention Provide advice, education, support and counseling about physical activity/exercise needs.;Develop an individualized exercise prescription for  aerobic and resistive training based on initial evaluation findings, risk stratification, comorbidities and participant's personal goals. Provide advice, education, support and counseling about physical activity/exercise needs.;Develop an individualized exercise prescription for aerobic and resistive training based on initial evaluation findings, risk stratification, comorbidities and participant's personal goals.      Expected Outcomes Short Term: Attend rehab on a regular basis to increase amount of physical activity.;Long Term: Add in home exercise to make exercise part of routine and to increase amount of physical activity.;Long Term: Exercising regularly at least 3-5 days a week. Short Term: Attend rehab on a regular basis to increase amount of physical activity.;Long Term: Add in home exercise to make exercise part of routine and to increase amount of physical activity.;Long Term: Exercising regularly at least 3-5 days a week.      Increase Strength and Stamina Yes Yes      Intervention Provide advice, education, support and counseling about physical activity/exercise needs.;Develop an individualized exercise prescription for aerobic and resistive training based on initial evaluation findings, risk stratification, comorbidities and participant's personal goals. Provide advice, education, support and counseling about physical activity/exercise needs.;Develop an individualized exercise prescription for aerobic and resistive training based on initial evaluation findings, risk stratification, comorbidities and participant's personal goals.      Expected Outcomes Short Term: Increase workloads from initial exercise prescription for resistance, speed, and METs.;Short Term: Perform resistance training exercises routinely during rehab and add in resistance training at home;Long Term: Improve cardiorespiratory fitness, muscular endurance and strength as measured by increased METs and functional capacity ( )  Short Term: Increase workloads from initial exercise prescription for resistance, speed, and METs.;Short Term: Perform resistance training exercises routinely during rehab and add in resistance training at home;Long Term: Improve cardiorespiratory fitness, muscular endurance and strength as measured by increased METs and functional capacity ( )      Able to understand and use rate of perceived  exertion (RPE) scale Yes Yes      Intervention Provide education and explanation on how to use RPE scale Provide education and explanation on how to use RPE scale      Expected Outcomes Short Term: Able to use RPE daily in rehab to express subjective intensity level;Long Term:  Able to use RPE to guide intensity level when exercising independently Short Term: Able to use RPE daily in rehab to express subjective intensity level;Long Term:  Able to use RPE to guide intensity level when exercising independently      Able to understand and use Dyspnea scale Yes Yes      Intervention Provide education and explanation on how to use Dyspnea scale Provide education and explanation on how to use Dyspnea scale      Expected Outcomes Short Term: Able to use Dyspnea scale daily in rehab to express subjective sense of shortness of breath during exertion;Long Term: Able to use Dyspnea scale to guide intensity level when exercising independently Short Term: Able to use Dyspnea scale daily in rehab to express subjective sense of shortness of breath during exertion;Long Term: Able to use Dyspnea scale to guide intensity level when exercising independently      Knowledge and understanding of Target Heart Rate Range (THRR) Yes Yes      Intervention Provide education and explanation of THRR including how the numbers were predicted and where they are located for reference Provide education and explanation of THRR including how the numbers were predicted and where they are located for reference      Expected Outcomes Short Term: Able to  state/look up THRR;Short Term: Able to use daily as guideline for intensity in rehab;Long Term: Able to use THRR to govern intensity when exercising independently Short Term: Able to state/look up THRR;Short Term: Able to use daily as guideline for intensity in rehab;Long Term: Able to use THRR to govern intensity when exercising independently      Able to check pulse independently Yes Yes      Intervention Provide education and demonstration on how to check pulse in carotid and radial arteries.;Review the importance of being able to check your own pulse for safety during independent exercise Provide education and demonstration on how to check pulse in carotid and radial arteries.;Review the importance of being able to check your own pulse for safety during independent exercise      Expected Outcomes Short Term: Able to explain why pulse checking is important during independent exercise;Long Term: Able to check pulse independently and accurately Short Term: Able to explain why pulse checking is important during independent exercise;Long Term: Able to check pulse independently and accurately      Understanding of Exercise Prescription Yes Yes      Intervention Provide education, explanation, and written materials on patient's individual exercise prescription Provide education, explanation, and written materials on patient's individual exercise prescription      Expected Outcomes Long Term: Able to explain home exercise prescription to exercise independently;Short Term: Able to explain program exercise prescription Long Term: Able to explain home exercise prescription to exercise independently;Short Term: Able to explain program exercise prescription         Exercise Goals Re-Evaluation :   Discharge Exercise Prescription (Final Exercise Prescription Changes):  Exercise Prescription Changes - 02/09/24 1400       Response to Exercise   Blood Pressure (Admit) 116/58    Blood Pressure (Exercise) 146/74     Blood Pressure (Exit) 118/66    Heart  Rate (Admit) 75 bpm    Heart Rate (Exercise) 98 bpm    Heart Rate (Exit) 72 bpm    Oxygen Saturation (Admit) 98 %    Oxygen Saturation (Exercise) 97 %    Oxygen Saturation (Exit) 100 %    Rating of Perceived Exertion (Exercise) 17    Perceived Dyspnea (Exercise) 3    Symptoms chronic back and hip pain 10/10, R leg numbness    Comments walk test results          Nutrition:  Target Goals: Understanding of nutrition guidelines, daily intake of sodium 1500mg , cholesterol 200mg , calories 30% from fat and 7% or less from saturated fats, daily to have 5 or more servings of fruits and vegetables.  Biometrics:  Pre Biometrics - 02/09/24 1457       Pre Biometrics   Height 5' 7 (1.702 m)    Weight 56.4 kg    Waist Circumference 31 inches    Hip Circumference 36 inches    Waist to Hip Ratio 0.86 %    BMI (Calculated) 19.46    Grip Strength 28.7 kg    Single Leg Stand 3.7 seconds           Nutrition Therapy Plan and Nutrition Goals:  Nutrition Therapy & Goals - 02/02/24 1333       Intervention Plan   Intervention Prescribe, educate and counsel regarding individualized specific dietary modifications aiming towards targeted core components such as weight, hypertension, lipid management, diabetes, heart failure and other comorbidities.;Nutrition handout(s) given to patient.    Expected Outcomes Short Term Goal: Understand basic principles of dietary content, such as calories, fat, sodium, cholesterol and nutrients.;Short Term Goal: A plan has been developed with personal nutrition goals set during dietitian appointment.;Long Term Goal: Adherence to prescribed nutrition plan.          Nutrition Assessments:  MEDIFICTS Score Key: >=70 Need to make dietary changes  40-70 Heart Healthy Diet <= 40 Therapeutic Level Cholesterol Diet  Flowsheet Row PULMONARY REHAB OTHER RESP ORIENTATION from 02/09/2024 in [facility]   Picture Your Plate Total Score on Admission 63   Picture Your Plate Scores: <59 Unhealthy dietary pattern with much room for improvement. 41-50 Dietary pattern unlikely to meet recommendations for good health and room for improvement. 51-60 More healthful dietary pattern, with some room for improvement.  >60 Healthy dietary pattern, although there may be some specific behaviors that could be improved.    Nutrition Goals Re-Evaluation:   Nutrition Goals Discharge (Final Nutrition Goals Re-Evaluation):   Psychosocial: Target Goals: Acknowledge presence or absence of significant depression and/or stress, maximize coping skills, provide positive support system. Participant is able to verbalize types and ability to use techniques and skills needed for reducing stress and depression.  Initial Review & Psychosocial Screening:  Initial Psych Review & Screening - 02/02/24 1334       Initial Review   Current issues with Current Stress Concerns    Source of Stress Concerns Chronic Illness    Comments Malakai has a lot of co-morbidities that has caused him to be stressed. He states the last 2 years have been rough on him.      Family Dynamics   Good Support System? Yes    Comments Merlyn has his friend Sari who is also is medical POA.      Barriers   Psychosocial barriers to participate in program The patient should benefit from training in stress management and relaxation.  Screening Interventions   Interventions Encouraged to exercise    Expected Outcomes Short Term goal: Utilizing psychosocial counselor, staff and physician to assist with identification of specific Stressors or current issues interfering with healing process. Setting desired goal for each stressor or current issue identified.;Long Term Goal: Stressors or current issues are controlled or eliminated.;Short Term goal: Identification and review with participant of any Quality of Life or Depression concerns found by  scoring the questionnaire.;Long Term goal: The participant improves quality of Life and PHQ9 Scores as seen by post scores and/or verbalization of changes          Quality of Life Scores:  Scores of 19 and below usually indicate a poorer quality of life in these areas.  A difference of  2-3 points is a clinically meaningful difference.  A difference of 2-3 points in the total score of the Quality of Life Index has been associated with significant improvement in overall quality of life, self-image, physical symptoms, and general health in studies assessing change in quality of life.  PHQ-9: Review Flowsheet       02/09/2024  Depression screen PHQ 2/9  Decreased Interest 3  Down, Depressed, Hopeless 2  PHQ - 2 Score 5  Altered sleeping 1  Tired, decreased energy 3  Change in appetite 3  Feeling bad or failure about yourself  1  Trouble concentrating 1  Moving slowly or fidgety/restless 1  Suicidal thoughts 0  PHQ-9 Score 15  Difficult doing work/chores Very difficult   Interpretation of Total Score  Total Score Depression Severity:  1-4 = Minimal depression, 5-9 = Mild depression, 10-14 = Moderate depression, 15-19 = Moderately severe depression, 20-27 = Severe depression   Psychosocial Evaluation and Intervention:  Psychosocial Evaluation - 02/02/24 1335       Psychosocial Evaluation & Interventions   Interventions Stress management education;Relaxation education;Encouraged to exercise with the program and follow exercise prescription    Comments Romano is a pleasant 68 year old male who is coming into pulmonary rehab for emphysema. Croy has a lot of co-morbities that contributes to his illness such as COPD, cirrhosis, chronic pain, a feeding tube and being legally blind. He states he has had multiple falls in the last year with no injury. He uses a cane to get around. He also states he uses a magnifying glass to help him read. He states he drives some to familiar places,  but he will have his friend Sari drive him. His friend Sari is also his medical POA. He currently doesn't do any exercise at home, and he gets dizzy and lightheaded sometimes. He is eager to start the program to help him get better.    Expected Outcomes Short: Help SOB. Long: Get better and back to himself.    Continue Psychosocial Services  Follow up required by staff          Psychosocial Re-Evaluation:   Psychosocial Discharge (Final Psychosocial Re-Evaluation):   Education: Education Goals: Education classes will be provided on a weekly basis, covering required topics. Participant will state understanding/return demonstration of topics presented.  Learning Barriers/Preferences:  Learning Barriers/Preferences - 02/02/24 1333       Learning Barriers/Preferences   Learning Barriers Sight   legally blind   Learning Preferences Audio;Group Instruction;Individual Instruction;Pictoral;Skilled Demonstration          Education Topics: Know Your Numbers Group instruction that is supported by a PowerPoint presentation. Instructor discusses importance of knowing and understanding resting, exercise, and post-exercise oxygen saturation, heart rate,  and blood pressure. Oxygen saturation, heart rate, blood pressure, rating of perceived exertion, and dyspnea are reviewed along with a normal range for these values.    Exercise for the Pulmonary Patient Group instruction that is supported by a PowerPoint presentation. Instructor discusses benefits of exercise, core components of exercise, frequency, duration, and intensity of an exercise routine, importance of utilizing pulse oximetry during exercise, safety while exercising, and options of places to exercise outside of rehab.    MET Level  Group instruction provided by PowerPoint, verbal discussion, and written material to support subject matter. Instructor reviews what METs are and how to increase METs.    Pulmonary  Medications Verbally interactive group education provided by instructor with focus on inhaled medications and proper administration.   Anatomy and Physiology of the Respiratory System Group instruction provided by PowerPoint, verbal discussion, and written material to support subject matter. Instructor reviews respiratory cycle and anatomical components of the respiratory system and their functions. Instructor also reviews differences in obstructive and restrictive respiratory diseases with examples of each.    Oxygen Safety Group instruction provided by PowerPoint, verbal discussion, and written material to support subject matter. There is an overview of "What is Oxygen" and "Why do we need it".  Instructor also reviews how to create a safe environment for oxygen use, the importance of using oxygen as prescribed, and the risks of noncompliance. There is a brief discussion on traveling with oxygen and resources the patient may utilize.   Oxygen Use Group instruction provided by PowerPoint, verbal discussion, and written material to discuss how supplemental oxygen is prescribed and different types of oxygen supply systems. Resources for more information are provided.    Breathing Techniques Group instruction that is supported by demonstration and informational handouts. Instructor discusses the benefits of pursed lip and diaphragmatic breathing and detailed demonstration on how to perform both.     Risk Factor Reduction Group instruction that is supported by a PowerPoint presentation. Instructor discusses the definition of a risk factor, different risk factors for pulmonary disease, and how the heart and lungs work together.   Pulmonary Diseases Group instruction provided by PowerPoint, verbal discussion, and written material to support subject matter. Instructor gives an overview of the different type of pulmonary diseases. There is also a discussion on risk factors and symptoms as well as  ways to manage the diseases.   Stress and Energy Conservation Group instruction provided by PowerPoint, verbal discussion, and written material to support subject matter. Instructor gives an overview of stress and the impact it can have on the body. Instructor also reviews ways to reduce stress. There is also a discussion on energy conservation and ways to conserve energy throughout the day.   Warning Signs and Symptoms Group instruction provided by PowerPoint, verbal discussion, and written material to support subject matter. Instructor reviews warning signs and symptoms of stroke, heart attack, cold and flu. Instructor also reviews ways to prevent the spread of infection.   Other Education Group or individual verbal, written, or video instructions that support the educational goals of the pulmonary rehab program.    Knowledge Questionnaire Score:   Core Components/Risk Factors/Patient Goals at Admission:  Personal Goals and Risk Factors at Admission - 02/09/24 1504       Core Components/Risk Factors/Patient Goals on Admission    Weight Management Yes;Weight Gain    Intervention Weight Management: Develop a combined nutrition and exercise program designed to reach desired caloric intake, while maintaining appropriate intake of nutrient and fiber,  sodium and fats, and appropriate energy expenditure required for the weight goal.;Weight Management: Provide education and appropriate resources to help participant work on and attain dietary goals.    Admit Weight 124 lb 4.8 oz (56.4 kg)    Goal Weight: Short Term 128 lb (58.1 kg)    Goal Weight: Long Term 130 lb (59 kg)    Expected Outcomes Short Term: Continue to assess and modify interventions until short term weight is achieved;Long Term: Adherence to nutrition and physical activity/exercise program aimed toward attainment of established weight goal;Weight Gain: Understanding of general recommendations for a high calorie, high protein meal  plan that promotes weight gain by distributing calorie intake throughout the day with the consumption for 4-5 meals, snacks, and/or supplements    Improve shortness of breath with ADL's Yes    Intervention Provide education, individualized exercise plan and daily activity instruction to help decrease symptoms of SOB with activities of daily living.    Expected Outcomes Short Term: Improve cardiorespiratory fitness to achieve a reduction of symptoms when performing ADLs;Long Term: Be able to perform more ADLs without symptoms or delay the onset of symptoms    Increase knowledge of respiratory medications and ability to use respiratory devices properly  Yes    Intervention Provide education and demonstration as needed of appropriate use of medications, inhalers, and oxygen therapy.    Expected Outcomes Short Term: Achieves understanding of medications use. Understands that oxygen is a medication prescribed by physician. Demonstrates appropriate use of inhaler and oxygen therapy.;Long Term: Maintain appropriate use of medications, inhalers, and oxygen therapy.    Stress Yes    Intervention Offer individual and/or small group education and counseling on adjustment to heart disease, stress management and health-related lifestyle change. Teach and support self-help strategies.;Refer participants experiencing significant psychosocial distress to appropriate mental health specialists for further evaluation and treatment. When possible, include family members and significant others in education/counseling sessions.    Expected Outcomes Short Term: Participant demonstrates changes in health-related behavior, relaxation and other stress management skills, ability to obtain effective social support, and compliance with psychotropic medications if prescribed.;Long Term: Emotional wellbeing is indicated by absence of clinically significant psychosocial distress or social isolation.          Core Components/Risk  Factors/Patient Goals Review:    Core Components/Risk Factors/Patient Goals at Discharge (Final Review):    ITP Comments:  ITP Comments     Row Name 02/02/24 1343 02/09/24 1448         ITP Comments Completed virtual orientation today.  EP evaluation is scheduled for 02/09/24 at 1:00 .  Documentation for diagnosis can be found in [facility] encounter care everywhere, [facility]. Patient attend orientation today.  Patient is attending Pulmonary Rehabilitation Program.  Documentation for diagnosis can be found in [facility] 01/01/2024.  Reviewed medical chart, RPE/RPD, gym safety, and program guidelines.  Patient was fitted to equipment they will be using during rehab.  Patient is scheduled to start exercise on Tuesday March 02, 2024.   Initial ITP created and sent for review and signature by Dr. Anton Kelp, Medical Director for Pulmonary Rehabilitation Program.         Comments: Initial ITP

## 2024-02-09 NOTE — Patient Instructions (Signed)
 Patient Instructions  Patient Details  Name: Martin White MRN: [id number] Date of Birth: 1955-06-30 Referring Provider:  Erlene Gearing, MD  Below are your personal goals for exercise, nutrition, and risk factors. Our goal is to help you stay on track towards obtaining and maintaining these goals. We will be discussing your progress on these goals with you throughout the program.  Initial Exercise Prescription:  Initial Exercise Prescription - 02/09/24 1400       Date of Initial Exercise RX and Referring Provider   Date 02/09/24    Referring Provider Erlene Gearing MD      Oxygen   Maintain Oxygen Saturation 88% or higher      Treadmill   MPH 0.8    Grade 0    Minutes 15    METs 1.6      NuStep   Level 1    SPM 80    Minutes 15    METs 1.5      REL-XR   Level 1    Speed 50    Minutes 15    METs 1.5      Prescription Details   Frequency (times per week) 2    Duration Progress to 30 minutes of continuous aerobic without signs/symptoms of physical distress      Intensity   THRR 40-80% of Max Heartrate 105-136    Ratings of Perceived Exertion 11-13    Perceived Dyspnea 0-4      Progression   Progression Continue to progress workloads to maintain intensity without signs/symptoms of physical distress.      Resistance Training   Training Prescription Yes    Weight 4 lb    Reps 10-15          Exercise Goals: Frequency: Be able to perform aerobic exercise two to three times per week in program working toward 2-5 days per week of home exercise.  Intensity: Work with a perceived exertion of 11 (fairly light) - 15 (hard) while following your exercise prescription.  We will make changes to your prescription with you as you progress through the program.   Duration: Be able to do 30 to 45 minutes of continuous aerobic exercise in addition to a 5 minute warm-up and a 5 minute cool-down routine.   Nutrition Goals: Your personal nutrition goals will be  established when you do your nutrition analysis with the dietician.  The following are general nutrition guidelines to follow: Cholesterol < 200mg /day Sodium < 1500mg /day Fiber: Men over 50 yrs - 30 grams per day  Personal Goals:  Personal Goals and Risk Factors at Admission - 02/09/24 1504       Core Components/Risk Factors/Patient Goals on Admission    Weight Management Yes;Weight Gain    Intervention Weight Management: Develop a combined nutrition and exercise program designed to reach desired caloric intake, while maintaining appropriate intake of nutrient and fiber, sodium and fats, and appropriate energy expenditure required for the weight goal.;Weight Management: Provide education and appropriate resources to help participant work on and attain dietary goals.    Admit Weight 124 lb 4.8 oz (56.4 kg)    Goal Weight: Short Term 128 lb (58.1 kg)    Goal Weight: Long Term 130 lb (59 kg)    Expected Outcomes Short Term: Continue to assess and modify interventions until short term weight is achieved;Long Term: Adherence to nutrition and physical activity/exercise program aimed toward attainment of established weight goal;Weight Gain: Understanding of general recommendations for a high calorie, high protein  meal plan that promotes weight gain by distributing calorie intake throughout the day with the consumption for 4-5 meals, snacks, and/or supplements    Improve shortness of breath with ADL's Yes    Intervention Provide education, individualized exercise plan and daily activity instruction to help decrease symptoms of SOB with activities of daily living.    Expected Outcomes Short Term: Improve cardiorespiratory fitness to achieve a reduction of symptoms when performing ADLs;Long Term: Be able to perform more ADLs without symptoms or delay the onset of symptoms    Increase knowledge of respiratory medications and ability to use respiratory devices properly  Yes    Intervention Provide  education and demonstration as needed of appropriate use of medications, inhalers, and oxygen therapy.    Expected Outcomes Short Term: Achieves understanding of medications use. Understands that oxygen is a medication prescribed by physician. Demonstrates appropriate use of inhaler and oxygen therapy.;Long Term: Maintain appropriate use of medications, inhalers, and oxygen therapy.    Stress Yes    Intervention Offer individual and/or small group education and counseling on adjustment to heart disease, stress management and health-related lifestyle change. Teach and support self-help strategies.;Refer participants experiencing significant psychosocial distress to appropriate mental health specialists for further evaluation and treatment. When possible, include family members and significant others in education/counseling sessions.    Expected Outcomes Short Term: Participant demonstrates changes in health-related behavior, relaxation and other stress management skills, ability to obtain effective social support, and compliance with psychotropic medications if prescribed.;Long Term: Emotional wellbeing is indicated by absence of clinically significant psychosocial distress or social isolation.          Tobacco Use Initial Evaluation: Social History   Tobacco Use  Smoking Status Some Days  Smokeless Tobacco Never    Exercise Goals and Review:  Exercise Goals     Row Name 02/02/24 1329            Exercise Goals   Increase Physical Activity Yes       Intervention Provide advice, education, support and counseling about physical activity/exercise needs.;Develop an individualized exercise prescription for aerobic and resistive training based on initial evaluation findings, risk stratification, comorbidities and participant's personal goals.       Expected Outcomes Short Term: Attend rehab on a regular basis to increase amount of physical activity.;Long Term: Add in home exercise to make  exercise part of routine and to increase amount of physical activity.;Long Term: Exercising regularly at least 3-5 days a week.       Increase Strength and Stamina Yes       Intervention Provide advice, education, support and counseling about physical activity/exercise needs.;Develop an individualized exercise prescription for aerobic and resistive training based on initial evaluation findings, risk stratification, comorbidities and participant's personal goals.       Expected Outcomes Short Term: Increase workloads from initial exercise prescription for resistance, speed, and METs.;Short Term: Perform resistance training exercises routinely during rehab and add in resistance training at home;Long Term: Improve cardiorespiratory fitness, muscular endurance and strength as measured by increased METs and functional capacity ( )       Able to understand and use rate of perceived exertion (RPE) scale Yes       Intervention Provide education and explanation on how to use RPE scale       Expected Outcomes Short Term: Able to use RPE daily in rehab to express subjective intensity level;Long Term:  Able to use RPE to guide intensity level when exercising independently  Able to understand and use Dyspnea scale Yes       Intervention Provide education and explanation on how to use Dyspnea scale       Expected Outcomes Short Term: Able to use Dyspnea scale daily in rehab to express subjective sense of shortness of breath during exertion;Long Term: Able to use Dyspnea scale to guide intensity level when exercising independently       Knowledge and understanding of Target Heart Rate Range (THRR) Yes       Intervention Provide education and explanation of THRR including how the numbers were predicted and where they are located for reference       Expected Outcomes Short Term: Able to state/look up THRR;Short Term: Able to use daily as guideline for intensity in rehab;Long Term: Able to use THRR to govern  intensity when exercising independently       Able to check pulse independently Yes       Intervention Provide education and demonstration on how to check pulse in carotid and radial arteries.;Review the importance of being able to check your own pulse for safety during independent exercise       Expected Outcomes Short Term: Able to explain why pulse checking is important during independent exercise;Long Term: Able to check pulse independently and accurately       Understanding of Exercise Prescription Yes       Intervention Provide education, explanation, and written materials on patient's individual exercise prescription       Expected Outcomes Long Term: Able to explain home exercise prescription to exercise independently;Short Term: Able to explain program exercise prescription        Copy of goals given to participant.

## 2024-03-02 ENCOUNTER — Encounter (HOSPITAL_COMMUNITY)

## 2024-03-02 ENCOUNTER — Telehealth (HOSPITAL_COMMUNITY): Payer: Self-pay

## 2024-03-02 ENCOUNTER — Encounter (HOSPITAL_COMMUNITY): Payer: Self-pay | Admitting: *Deleted

## 2024-03-02 DIAGNOSIS — J432 Centrilobular emphysema: Secondary | ICD-10-CM

## 2024-03-02 NOTE — Progress Notes (Signed)
 Pulmonary Individual Treatment Plan  Patient Details  Name: Martin White MRN: [id number] Date of Birth: 07-18-1955 Referring Provider:   Flowsheet Row PULMONARY REHAB OTHER RESP ORIENTATION from 02/09/2024 in [facility] CARDIAC REHABILITATION  Referring Provider Erlene Gearing MD    Initial Encounter Date:  Flowsheet Row PULMONARY REHAB OTHER RESP ORIENTATION from 02/09/2024 in [place] [facility]IL[facility]ATION  Date 02/09/24    Visit Diagnosis: Centrilobular emphysema (HCC)  Patient's Home Medications on Admission:   Current Outpatient Medications:    albuterol (ACCUNEB) 0.63 MG/3ML nebulizer solution, Inhale 0.63 mg into the lungs., Disp: , Rfl:    albuterol (ACCUNEB) 1.25 MG/3ML nebulizer solution, Inhale 1.25 mg into the lungs., Disp: , Rfl:    albuterol (VENTOLIN HFA) 108 (90 Base) MCG/ACT inhaler, Inhale 2 puffs into the lungs every 6 (six) hours as needed., Disp: , Rfl:    ammonium lactate (AMLACTIN) 12 % cream, Apply 1 Application topically as needed., Disp: , Rfl:    ascorbic acid (VITAMIN C) 1000 MG tablet, Take 1,000 mg by mouth daily., Disp: , Rfl:    B Complex Vitamins (VITAMIN B-COMPLEX) TABS, Take 1 tablet by mouth daily., Disp: , Rfl:    Carboxymethylcellulose Sod PF 1 % GEL, 1 drop., Disp: , Rfl:    Cholecalciferol 125 MCG (5000 UT) TABS, Take 5,000 Units by mouth., Disp: , Rfl:    diclofenac Sodium (VOLTAREN) 1 % GEL, Apply 2 g topically 4 (four) times daily., Disp: , Rfl:    gabapentin (NEURONTIN) 600 MG tablet, Take 600 mg by mouth as needed., Disp: , Rfl:    ibuprofen (ADVIL) 200 MG tablet, Take 800 mg by mouth., Disp: , Rfl:    megestrol (MEGACE) 20 MG tablet, Take 40 mg by mouth 4 (four) times daily., Disp: , Rfl:    mirtazapine (REMERON) 7.5 MG tablet, Take 7.5 mg by mouth., Disp: , Rfl:    olopatadine (PATANOL) 0.1 % ophthalmic solution, Apply 1 drop to eye., Disp: , Rfl:    Olopatadine HCl 0.2 % SOLN, 1 drop., Disp: , Rfl:    omega-3 fish oil (MAXEPA)  1000 MG CAPS capsule, Take 1 capsule by mouth daily., Disp: , Rfl:    oxyCODONE  (ROXICODONE ) 15 MG immediate release tablet, Take 15 mg by mouth every 6 (six) hours as needed., Disp: , Rfl:    potassium chloride SA (KLOR-CON M) 20 MEQ tablet, Take 20 mEq by mouth 2 (two) times daily., Disp: , Rfl:    TRELEGY ELLIPTA 100-62.5-25 MCG/ACT AEPB, Inhale 1 puff into the lungs daily., Disp: , Rfl:   Past Medical History: Past Medical History:  Diagnosis Date   COPD (chronic obstructive pulmonary disease) (HCC)    Degenerative cervical disc    Diabetes mellitus without complication (HCC)    Legally blind     Tobacco Use: Social History   Tobacco Use  Smoking Status Some Days  Smokeless Tobacco Never    Labs: Review Flowsheet        No data to display          Capillary Blood Glucose: No results found for: GLUCAP   Pulmonary Assessment Scores:  Pulmonary Assessment Scores     Row Name 02/09/24 1509         ADL UCSD   ADL Phase Entry       mMRC Score   mMRC Score 4       UCSD: Self-administered rating of dyspnea associated with activities of daily living (ADLs) 6-point scale (0 = not  at all to 5 = maximal or unable to do because of breathlessness)  Scoring Scores range from 0 to 120.  Minimally important difference is 5 units  CAT: CAT can identify the health impairment of COPD patients and is better correlated with disease progression.  CAT has a scoring range of zero to 40. The CAT score is classified into four groups of low (less than 10), medium (10 - 20), high (21-30) and very high (31-40) based on the impact level of disease on health status. A CAT score over 10 suggests significant symptoms.  A worsening CAT score could be explained by an exacerbation, poor medication adherence, poor inhaler technique, or progression of COPD or comorbid conditions.  CAT MCID is 2 points  mMRC: mMRC (Modified Medical Research Council) Dyspnea Scale is used to assess the  degree of baseline functional disability in patients of respiratory disease due to dyspnea. No minimal important difference is established. A decrease in score of 1 point or greater is considered a positive change.   Pulmonary Function Assessment:   Exercise Target Goals: Exercise Program Goal: Individual exercise prescription set using results from initial 6 min walk test and THRR while considering  patient's activity barriers and safety.   Exercise Prescription Goal: Initial exercise prescription builds to 30-45 minutes a day of aerobic activity, 2-3 days per week.  Home exercise guidelines will be given to patient during program as part of exercise prescription that the participant will acknowledge.  Activity Barriers & Risk Stratification:  Activity Barriers & Cardiac Risk Stratification - 02/02/24 1308       Activity Barriers & Cardiac Risk Stratification   Activity Barriers History of Falls;Shortness of Breath;Neck/Spine Problems;Deconditioning;Balance Concerns;Other (comment)    Comments Chronic pain, mostly in back          6 Minute Walk:  6 Minute Walk     Row Name 02/09/24 1451         6 Minute Walk   Phase Initial     Distance 890 feet     Walk Time 6 minutes     # of Rest Breaks 0     MPH 1.68     METS 2.79     RPE 17     Perceived Dyspnea  3     VO2 Peak 9.78     Symptoms Yes (comment)     Comments chronic back and hip pain 10/10, R leg numbness     Resting HR 75 bpm     Resting BP 116/58     Resting Oxygen Saturation  98 %     Exercise Oxygen Saturation  during 6 min walk 97 %     Max Ex. HR 98 bpm     Max Ex. BP 146/74     2 Minute Post BP 132/60       Interval HR   1 Minute HR 95     2 Minute HR 92     3 Minute HR 94     4 Minute HR 94     5 Minute HR 98     6 Minute HR 90     2 Minute Post HR 71     Interval Heart Rate? Yes       Interval Oxygen   Interval Oxygen? Yes     Baseline Oxygen Saturation % 98 %     1 Minute Oxygen Saturation  % 97 %     1 Minute Liters of Oxygen 0 L  Room Air     2 Minute Oxygen Saturation % 98 %     2 Minute Liters of Oxygen 0 L     3 Minute Oxygen Saturation % 98 %     3 Minute Liters of Oxygen 0 L     4 Minute Oxygen Saturation % 99 %     4 Minute Liters of Oxygen 0 L     5 Minute Oxygen Saturation % 100 %     5 Minute Liters of Oxygen 0 L     6 Minute Oxygen Saturation % 98 %     6 Minute Liters of Oxygen 0 L     2 Minute Post Oxygen Saturation % 100 %     2 Minute Post Liters of Oxygen 0 L        Oxygen Initial Assessment:  Oxygen Initial Assessment - 02/09/24 1509       Home Oxygen   Home Oxygen Device None    Sleep Oxygen Prescription None    Home Exercise Oxygen Prescription None    Home Resting Oxygen Prescription None      Initial 6 min Walk   Oxygen Used None      Program Oxygen Prescription   Program Oxygen Prescription None      Intervention   Short Term Goals To learn and understand importance of maintaining oxygen saturations>88%;To learn and demonstrate proper pursed lip breathing techniques or other breathing techniques. ;To learn and demonstrate proper use of respiratory medications;To learn and understand importance of monitoring SPO2 with pulse oximeter and demonstrate accurate use of the pulse oximeter.    Long  Term Goals Exhibits proper breathing techniques, such as pursed lip breathing or other method taught during program session;Compliance with respiratory medication;Demonstrates proper use of MDI's;Maintenance of O2 saturations>88%;Verbalizes importance of monitoring SPO2 with pulse oximeter and return demonstration          Oxygen Re-Evaluation:   Oxygen Discharge (Final Oxygen Re-Evaluation):   Initial Exercise Prescription:  Initial Exercise Prescription - 02/09/24 1400       Date of Initial Exercise RX and Referring Provider   Date 02/09/24    Referring Provider Erlene Gearing MD      Oxygen   Maintain Oxygen Saturation 88% or higher       Treadmill   MPH 0.8    Grade 0    Minutes 15    METs 1.6      NuStep   Level 1    SPM 80    Minutes 15    METs 1.5      REL-XR   Level 1    Speed 50    Minutes 15    METs 1.5      Prescription Details   Frequency (times per week) 2    Duration Progress to 30 minutes of continuous aerobic without signs/symptoms of physical distress      Intensity   THRR 40-80% of Max Heartrate 105-136    Ratings of Perceived Exertion 11-13    Perceived Dyspnea 0-4      Progression   Progression Continue to progress workloads to maintain intensity without signs/symptoms of physical distress.      Resistance Training   Training Prescription Yes    Weight 4 lb    Reps 10-15          Perform Capillary Blood Glucose checks as needed.  Exercise Prescription Changes:   Exercise Prescription Changes     Row Name 02/09/24  1400             Response to Exercise   Blood Pressure (Admit) 116/58       Blood Pressure (Exercise) 146/74       Blood Pressure (Exit) 118/66       Heart Rate (Admit) 75 bpm       Heart Rate (Exercise) 98 bpm       Heart Rate (Exit) 72 bpm       Oxygen Saturation (Admit) 98 %       Oxygen Saturation (Exercise) 97 %       Oxygen Saturation (Exit) 100 %       Rating of Perceived Exertion (Exercise) 17       Perceived Dyspnea (Exercise) 3       Symptoms chronic back and hip pain 10/10, R leg numbness       Comments walk test results          Exercise Comments:   Exercise Comments     Row Name 02/02/24 1329           Exercise Comments Martin White currently doesn't do any exercise at home. He does his ADL's but that's it.          Exercise Goals and Review:   Exercise Goals     Row Name 02/02/24 1329 02/09/24 1456           Exercise Goals   Increase Physical Activity Yes Yes      Intervention Provide advice, education, support and counseling about physical activity/exercise needs.;Develop an individualized exercise prescription for  aerobic and resistive training based on initial evaluation findings, risk stratification, comorbidities and participant's personal goals. Provide advice, education, support and counseling about physical activity/exercise needs.;Develop an individualized exercise prescription for aerobic and resistive training based on initial evaluation findings, risk stratification, comorbidities and participant's personal goals.      Expected Outcomes Short Term: Attend rehab on a regular basis to increase amount of physical activity.;Long Term: Add in home exercise to make exercise part of routine and to increase amount of physical activity.;Long Term: Exercising regularly at least 3-5 days a week. Short Term: Attend rehab on a regular basis to increase amount of physical activity.;Long Term: Add in home exercise to make exercise part of routine and to increase amount of physical activity.;Long Term: Exercising regularly at least 3-5 days a week.      Increase Strength and Stamina Yes Yes      Intervention Provide advice, education, support and counseling about physical activity/exercise needs.;Develop an individualized exercise prescription for aerobic and resistive training based on initial evaluation findings, risk stratification, comorbidities and participant's personal goals. Provide advice, education, support and counseling about physical activity/exercise needs.;Develop an individualized exercise prescription for aerobic and resistive training based on initial evaluation findings, risk stratification, comorbidities and participant's personal goals.      Expected Outcomes Short Term: Increase workloads from initial exercise prescription for resistance, speed, and METs.;Short Term: Perform resistance training exercises routinely during rehab and add in resistance training at home;Long Term: Improve cardiorespiratory fitness, muscular endurance and strength as measured by increased METs and functional capacity ( )  Short Term: Increase workloads from initial exercise prescription for resistance, speed, and METs.;Short Term: Perform resistance training exercises routinely during rehab and add in resistance training at home;Long Term: Improve cardiorespiratory fitness, muscular endurance and strength as measured by increased METs and functional capacity ( )      Able to understand and use rate of perceived  exertion (RPE) scale Yes Yes      Intervention Provide education and explanation on how to use RPE scale Provide education and explanation on how to use RPE scale      Expected Outcomes Short Term: Able to use RPE daily in rehab to express subjective intensity level;Long Term:  Able to use RPE to guide intensity level when exercising independently Short Term: Able to use RPE daily in rehab to express subjective intensity level;Long Term:  Able to use RPE to guide intensity level when exercising independently      Able to understand and use Dyspnea scale Yes Yes      Intervention Provide education and explanation on how to use Dyspnea scale Provide education and explanation on how to use Dyspnea scale      Expected Outcomes Short Term: Able to use Dyspnea scale daily in rehab to express subjective sense of shortness of breath during exertion;Long Term: Able to use Dyspnea scale to guide intensity level when exercising independently Short Term: Able to use Dyspnea scale daily in rehab to express subjective sense of shortness of breath during exertion;Long Term: Able to use Dyspnea scale to guide intensity level when exercising independently      Knowledge and understanding of Target Heart Rate Range (THRR) Yes Yes      Intervention Provide education and explanation of THRR including how the numbers were predicted and where they are located for reference Provide education and explanation of THRR including how the numbers were predicted and where they are located for reference      Expected Outcomes Short Term: Able to  state/look up THRR;Short Term: Able to use daily as guideline for intensity in rehab;Long Term: Able to use THRR to govern intensity when exercising independently Short Term: Able to state/look up THRR;Short Term: Able to use daily as guideline for intensity in rehab;Long Term: Able to use THRR to govern intensity when exercising independently      Able to check pulse independently Yes Yes      Intervention Provide education and demonstration on how to check pulse in carotid and radial arteries.;Review the importance of being able to check your own pulse for safety during independent exercise Provide education and demonstration on how to check pulse in carotid and radial arteries.;Review the importance of being able to check your own pulse for safety during independent exercise      Expected Outcomes Short Term: Able to explain why pulse checking is important during independent exercise;Long Term: Able to check pulse independently and accurately Short Term: Able to explain why pulse checking is important during independent exercise;Long Term: Able to check pulse independently and accurately      Understanding of Exercise Prescription Yes Yes      Intervention Provide education, explanation, and written materials on patient's individual exercise prescription Provide education, explanation, and written materials on patient's individual exercise prescription      Expected Outcomes Long Term: Able to explain home exercise prescription to exercise independently;Short Term: Able to explain program exercise prescription Long Term: Able to explain home exercise prescription to exercise independently;Short Term: Able to explain program exercise prescription         Exercise Goals Re-Evaluation :   Discharge Exercise Prescription (Final Exercise Prescription Changes):  Exercise Prescription Changes - 02/09/24 1400       Response to Exercise   Blood Pressure (Admit) 116/58    Blood Pressure (Exercise) 146/74     Blood Pressure (Exit) 118/66    Heart  Rate (Admit) 75 bpm    Heart Rate (Exercise) 98 bpm    Heart Rate (Exit) 72 bpm    Oxygen Saturation (Admit) 98 %    Oxygen Saturation (Exercise) 97 %    Oxygen Saturation (Exit) 100 %    Rating of Perceived Exertion (Exercise) 17    Perceived Dyspnea (Exercise) 3    Symptoms chronic back and hip pain 10/10, R leg numbness    Comments walk test results          Nutrition:  Target Goals: Understanding of nutrition guidelines, daily intake of sodium 1500mg , cholesterol 200mg , calories 30% from fat and 7% or less from saturated fats, daily to have 5 or more servings of fruits and vegetables.  Biometrics:  Pre Biometrics - 02/09/24 1457       Pre Biometrics   Height 5' 7 (1.702 m)    Weight 124 lb 4.8 oz (56.4 kg)    Waist Circumference 31 inches    Hip Circumference 36 inches    Waist to Hip Ratio 0.86 %    BMI (Calculated) 19.46    Grip Strength 28.7 kg    Single Leg Stand 3.7 seconds           Nutrition Therapy Plan and Nutrition Goals:  Nutrition Therapy & Goals - 02/02/24 1333       Intervention Plan   Intervention Prescribe, educate and counsel regarding individualized specific dietary modifications aiming towards targeted core components such as weight, hypertension, lipid management, diabetes, heart failure and other comorbidities.;Nutrition handout(s) given to patient.    Expected Outcomes Short Term Goal: Understand basic principles of dietary content, such as calories, fat, sodium, cholesterol and nutrients.;Short Term Goal: A plan has been developed with personal nutrition goals set during dietitian appointment.;Long Term Goal: Adherence to prescribed nutrition plan.          Nutrition Assessments:  MEDIFICTS Score Key: >=70 Need to make dietary changes  40-70 Heart Healthy Diet <= 40 Therapeutic Level Cholesterol Diet  Flowsheet Row PULMONARY REHAB OTHER RESP ORIENTATION from 02/09/2024 in [facility]  CARDIAC REHABILITATION  Picture Your Plate Total Score on Admission 63   Picture Your Plate Scores: <59 Unhealthy dietary pattern with much room for improvement. 41-50 Dietary pattern unlikely to meet recommendations for good health and room for improvement. 51-60 More healthful dietary pattern, with some room for improvement.  >60 Healthy dietary pattern, although there may be some specific behaviors that could be improved.    Nutrition Goals Re-Evaluation:   Nutrition Goals Discharge (Final Nutrition Goals Re-Evaluation):   Psychosocial: Target Goals: Acknowledge presence or absence of significant depression and/or stress, maximize coping skills, provide positive support system. Participant is able to verbalize types and ability to use techniques and skills needed for reducing stress and depression.  Initial Review & Psychosocial Screening:  Initial Psych Review & Screening - 02/02/24 1334       Initial Review   Current issues with Current Stress Concerns    Source of Stress Concerns Chronic Illness    Comments Martin White has a lot of co-morbidities that has caused him to be stressed. He states the last 2 years have been rough on him.      Family Dynamics   Good Support System? Yes    Comments Martin White has his friend Sari who is also is medical POA.      Barriers   Psychosocial barriers to participate in program The patient should benefit from training in stress management and relaxation.  Screening Interventions   Interventions Encouraged to exercise    Expected Outcomes Short Term goal: Utilizing psychosocial counselor, staff and physician to assist with identification of specific Stressors or current issues interfering with healing process. Setting desired goal for each stressor or current issue identified.;Long Term Goal: Stressors or current issues are controlled or eliminated.;Short Term goal: Identification and review with participant of any Quality of Life or  Depression concerns found by scoring the questionnaire.;Long Term goal: The participant improves quality of Life and PHQ9 Scores as seen by post scores and/or verbalization of changes          Quality of Life Scores:  Scores of 19 and below usually indicate a poorer quality of life in these areas.  A difference of  2-3 points is a clinically meaningful difference.  A difference of 2-3 points in the total score of the Quality of Life Index has been associated with significant improvement in overall quality of life, self-image, physical symptoms, and general health in studies assessing change in quality of life.   PHQ-9: Review Flowsheet       02/09/2024  Depression screen PHQ 2/9  Decreased Interest 3  Down, Depressed, Hopeless 2  PHQ - 2 Score 5  Altered sleeping 1  Tired, decreased energy 3  Change in appetite 3  Feeling bad or failure about yourself  1  Trouble concentrating 1  Moving slowly or fidgety/restless 1  Suicidal thoughts 0  PHQ-9 Score 15  Difficult doing work/chores Very difficult   Interpretation of Total Score  Total Score Depression Severity:  1-4 = Minimal depression, 5-9 = Mild depression, 10-14 = Moderate depression, 15-19 = Moderately severe depression, 20-27 = Severe depression   Psychosocial Evaluation and Intervention:  Psychosocial Evaluation - 02/02/24 1335       Psychosocial Evaluation & Interventions   Interventions Stress management education;Relaxation education;Encouraged to exercise with the program and follow exercise prescription    Comments Martin White is a pleasant 68 year old male who is coming into pulmonary rehab for emphysema. Martin White has a lot of co-morbities that contributes to his illness such as COPD, cirrhosis, chronic pain, a feeding tube and being legally blind. He states he has had multiple falls in the last year with no injury. He uses a cane to get around. He also states he uses a magnifying glass to help him read. He states he  drives some to familiar places, but he will have his friend Sari drive him. His friend Sari is also his medical POA. He currently doesn't do any exercise at home, and he gets dizzy and lightheaded sometimes. He is eager to start the program to help him get better.    Expected Outcomes Short: Help SOB. Long: Get better and back to himself.    Continue Psychosocial Services  Follow up required by staff          Psychosocial Re-Evaluation:   Psychosocial Discharge (Final Psychosocial Re-Evaluation):    Education: Education Goals: Education classes will be provided on a weekly basis, covering required topics. Participant will state understanding/return demonstration of topics presented.  Learning Barriers/Preferences:  Learning Barriers/Preferences - 02/02/24 1333       Learning Barriers/Preferences   Learning Barriers Sight   legally blind   Learning Preferences Audio;Group Instruction;Individual Instruction;Pictoral;Skilled Demonstration          Education Topics: How Lungs Work and Diseases: - Discuss the anatomy of the lungs and diseases that can affect the lungs, such as COPD.   Exercise: -  Discuss the importance of exercise, FITT principles of exercise, normal and abnormal responses to exercise, and how to exercise safely.   Environmental Irritants: -Discuss types of environmental irritants and how to limit exposure to environmental irritants.   Meds/Inhalers and oxygen: - Discuss respiratory medications, definition of an inhaler and oxygen, and the proper way to use an inhaler and oxygen.   Energy Saving Techniques: - Discuss methods to conserve energy and decrease shortness of breath when performing activities of daily living.    Bronchial Hygiene / Breathing Techniques: - Discuss breathing mechanics, pursed-lip breathing technique,  proper posture, effective ways to clear airways, and other functional breathing techniques   Cleaning Equipment: - Provides  group verbal and written instruction about the health risks of elevated stress, cause of high stress, and healthy ways to reduce stress.   Nutrition I: Fats: - Discuss the types of cholesterol, what cholesterol does to the body, and how cholesterol levels can be controlled.   Nutrition II: Labels: -Discuss the different components of food labels and how to read food labels.   Respiratory Infections: - Discuss the signs and symptoms of respiratory infections, ways to prevent respiratory infections, and the importance of seeking medical treatment when having a respiratory infection.   Stress I: Signs and Symptoms: - Discuss the causes of stress, how stress may lead to anxiety and depression, and ways to limit stress.   Stress II: Relaxation: -Discuss relaxation techniques to limit stress.   Oxygen for Home/Travel: - Discuss how to prepare for travel when on oxygen and proper ways to transport and store oxygen to ensure safety.   Knowledge Questionnaire Score:   Core Components/Risk Factors/Patient Goals at Admission:  Personal Goals and Risk Factors at Admission - 02/09/24 1504       Core Components/Risk Factors/Patient Goals on Admission    Weight Management Yes;Weight Gain    Intervention Weight Management: Develop a combined nutrition and exercise program designed to reach desired caloric intake, while maintaining appropriate intake of nutrient and fiber, sodium and fats, and appropriate energy expenditure required for the weight goal.;Weight Management: Provide education and appropriate resources to help participant work on and attain dietary goals.    Admit Weight 124 lb 4.8 oz (56.4 kg)    Goal Weight: Short Term 128 lb (58.1 kg)    Goal Weight: Long Term 130 lb (59 kg)    Expected Outcomes Short Term: Continue to assess and modify interventions until short term weight is achieved;Long Term: Adherence to nutrition and physical activity/exercise program aimed toward  attainment of established weight goal;Weight Gain: Understanding of general recommendations for a high calorie, high protein meal plan that promotes weight gain by distributing calorie intake throughout the day with the consumption for 4-5 meals, snacks, and/or supplements    Improve shortness of breath with ADL's Yes    Intervention Provide education, individualized exercise plan and daily activity instruction to help decrease symptoms of SOB with activities of daily living.    Expected Outcomes Short Term: Improve cardiorespiratory fitness to achieve a reduction of symptoms when performing ADLs;Long Term: Be able to perform more ADLs without symptoms or delay the onset of symptoms    Increase knowledge of respiratory medications and ability to use respiratory devices properly  Yes    Intervention Provide education and demonstration as needed of appropriate use of medications, inhalers, and oxygen therapy.    Expected Outcomes Short Term: Achieves understanding of medications use. Understands that oxygen is a medication prescribed by physician. Demonstrates  appropriate use of inhaler and oxygen therapy.;Long Term: Maintain appropriate use of medications, inhalers, and oxygen therapy.    Stress Yes    Intervention Offer individual and/or small group education and counseling on adjustment to heart disease, stress management and health-related lifestyle change. Teach and support self-help strategies.;Refer participants experiencing significant psychosocial distress to appropriate mental health specialists for further evaluation and treatment. When possible, include family members and significant others in education/counseling sessions.    Expected Outcomes Short Term: Participant demonstrates changes in health-related behavior, relaxation and other stress management skills, ability to obtain effective social support, and compliance with psychotropic medications if prescribed.;Long Term: Emotional wellbeing is  indicated by absence of clinically significant psychosocial distress or social isolation.          Core Components/Risk Factors/Patient Goals Review:    Core Components/Risk Factors/Patient Goals at Discharge (Final Review):    ITP Comments:  ITP Comments     Row Name 02/02/24 1343 02/09/24 1448 03/02/24 1104       ITP Comments Completed virtual orientation today.  EP evaluation is scheduled for 02/09/24 at 1:00 .  Documentation for diagnosis can be found in [facility] encounter care everywhere, [facility]. Patient attend orientation today.  Patient is attending Pulmonary Rehabilitation Program.  Documentation for diagnosis can be found in [facility] 01/01/2024.  Reviewed medical chart, RPE/RPD, gym safety, and program guidelines.  Patient was fitted to equipment they will be using during rehab.  Patient is scheduled to start exercise on Tuesday March 02, 2024.   Initial ITP created and sent for review and signature by Dr. Anton Kelp, Medical Director for Pulmonary Rehabilitation Program. 30 day review completed. ITP sent to Dr.Jehanzeb Memon, Medical Director of  Pulmonary Rehab. Continue with ITP unless changes are made by physician.  Has yet to start exercise due to other appt.  Scheduled to start this afternoon.        Comments: 30 day review

## 2024-03-04 ENCOUNTER — Encounter (HOSPITAL_COMMUNITY)

## 2024-03-04 DIAGNOSIS — J432 Centrilobular emphysema: Secondary | ICD-10-CM | POA: Insufficient documentation

## 2024-03-09 ENCOUNTER — Encounter (HOSPITAL_COMMUNITY): Payer: Self-pay | Admitting: *Deleted

## 2024-03-09 ENCOUNTER — Encounter (HOSPITAL_COMMUNITY)

## 2024-03-09 ENCOUNTER — Telehealth (HOSPITAL_COMMUNITY): Payer: Self-pay | Admitting: *Deleted

## 2024-03-09 DIAGNOSIS — J432 Centrilobular emphysema: Secondary | ICD-10-CM

## 2024-03-09 NOTE — Telephone Encounter (Signed)
 Called patient back.  He is having a hard time affording the program.  He has gotten approval for Medicaid and is hoping it will help pay. We will look into his insurance again to see what his cost would look like.

## 2024-03-11 ENCOUNTER — Encounter (HOSPITAL_COMMUNITY)

## 2024-03-16 ENCOUNTER — Encounter (HOSPITAL_COMMUNITY)

## 2024-03-18 ENCOUNTER — Encounter (HOSPITAL_COMMUNITY)

## 2024-03-23 ENCOUNTER — Telehealth (HOSPITAL_COMMUNITY): Payer: Self-pay

## 2024-03-23 ENCOUNTER — Encounter (HOSPITAL_COMMUNITY)

## 2024-03-23 NOTE — Telephone Encounter (Signed)
 Spoke with patient about missed rehab session today. Patient thought we had taken out his December appointments. His medicaid does not start till April 01, 2024. He plans to start after the new year, as long as he is covered. Remaining December appointments have been canceled.

## 2024-03-26 ENCOUNTER — Emergency Department (HOSPITAL_COMMUNITY)
Admission: EM | Admit: 2024-03-26 | Discharge: 2024-03-26 | Disposition: A | Discharge status: Home / Self Care | Attending: Emergency Medicine | Admitting: Emergency Medicine

## 2024-03-26 ENCOUNTER — Encounter (HOSPITAL_COMMUNITY): Payer: Self-pay

## 2024-03-26 ENCOUNTER — Emergency Department (HOSPITAL_COMMUNITY)

## 2024-03-26 ENCOUNTER — Other Ambulatory Visit: Payer: Self-pay

## 2024-03-26 DIAGNOSIS — K9423 Gastrostomy malfunction: Secondary | ICD-10-CM | POA: Diagnosis present

## 2024-03-26 NOTE — ED Notes (Signed)
 EDP replaced PEG tube, placement confirmed with x-ray. Patient tolerated procedure without any problems noted. Education given on proper care of gastromy  tube. Patient verbalized understanding.

## 2024-03-26 NOTE — ED Triage Notes (Signed)
 Pt reports his feeding tube popped at the end and needs replaced. Pt has a clamp on tube during triage.

## 2024-03-27 NOTE — ED Provider Notes (Signed)
 " [place] EMERGENCY DEPARTMENT AT [facility] Provider Note   CSN: [id number] Arrival date & time: 03/26/24  8175     Patient presents with: Feeding Tube Replacement   Martin White is a 68 y.o. male.   HPI Patient presents with complications of his PEG tube.  States it was plugged up and the center of the tube popped.  Clamped upon arrival.  Reviewing tube it appears to be 18 French double port G-tube.  States he has a tube because he is losing so much weight.    Prior to Admission medications  Medication Sig Start Date End Date Taking? Authorizing Provider  albuterol (ACCUNEB) 0.63 MG/3ML nebulizer solution Inhale 0.63 mg into the lungs.    [provider]  albuterol (ACCUNEB) 1.25 MG/3ML nebulizer solution Inhale 1.25 mg into the lungs. 05/22/22   [provider]  albuterol (VENTOLIN HFA) 108 (90 Base) MCG/ACT inhaler Inhale 2 puffs into the lungs every 6 (six) hours as needed.    [provider]  ammonium lactate (AMLACTIN) 12 % cream Apply 1 Application topically as needed. 07/10/23   [provider]  ascorbic acid (VITAMIN C) 1000 MG tablet Take 1,000 mg by mouth daily. 04/25/21   [provider]  B Complex Vitamins (VITAMIN B-COMPLEX) TABS Take 1 tablet by mouth daily. 04/25/21   [provider]  Carboxymethylcellulose Sod PF 1 % GEL 1 drop.    [provider]  Cholecalciferol 125 MCG (5000 UT) TABS Take 5,000 Units by mouth. 03/21/23   [provider]  diclofenac Sodium (VOLTAREN) 1 % GEL Apply 2 g topically 4 (four) times daily. 08/27/19   [provider]  gabapentin (NEURONTIN) 600 MG tablet Take 600 mg by mouth as needed.    [provider]  ibuprofen (ADVIL) 200 MG tablet Take 800 mg by mouth.    [provider]  megestrol (MEGACE) 20 MG tablet Take 40 mg by mouth 4 (four) times daily.    [provider]  mirtazapine (REMERON) 7.5 MG tablet Take 7.5 mg  by mouth. 11/05/23   [provider]  olopatadine (PATANOL) 0.1 % ophthalmic solution Apply 1 drop to eye. 07/01/18   [provider]  Olopatadine HCl 0.2 % SOLN 1 drop. 10/15/16   [provider]  omega-3 fish oil (MAXEPA) 1000 MG CAPS capsule Take 1 capsule by mouth daily.    [provider]  oxyCODONE  (ROXICODONE ) 15 MG immediate release tablet Take 15 mg by mouth every 6 (six) hours as needed.    [provider]  potassium chloride SA (KLOR-CON M) 20 MEQ tablet Take 20 mEq by mouth 2 (two) times daily.    [provider]  TRELEGY ELLIPTA 100-62.5-25 MCG/ACT AEPB Inhale 1 puff into the lungs daily.    [provider]    Allergies: Patient has no known allergies.    Review of Systems  Updated Vital Signs BP 118/72 (BP Location: Right Arm)   Pulse 70   Temp 98 F (36.7 C) (Oral)   Resp 17   Ht 5' 7 (1.702 m)   Wt 56.4 kg   SpO2 98%   BMI 19.47 kg/m   Physical Exam Vitals and nursing note reviewed.  Abdominal:     Comments: G-tube in place but does have breaks in the tube.  Neurological:     Mental Status: He is alert.     (all labs ordered are listed, but only abnormal results are displayed) Labs  Reviewed - No data to display  EKG: None  Radiology: DG Abdomen PEG Tube Location Result Date: 03/26/2024 EXAM: 1 VIEW XRAY OF THE ABDOMEN 03/26/2024 08:59:00 PM COMPARISON: None available. CLINICAL HISTORY: PEG tube malfunction (HCC) FINDINGS: LINES, TUBES AND DEVICES: Gastrostomy tube is not well visualized though this tracks toward the proximal body of the stomach. Gastrostomy tube balloon not inflated. BOWEL: Instilled contrast opacifies a nondistended gastric lumen and extends into the duodenum and proximal jejunum. No evidence of obstruction. No extraluminal extension of contrast is identified. Nonobstructive bowel gas pattern. SOFT TISSUES: No abnormal calcifications. BONES: Degenerative changes are seen within the  visualized spine. No acute fracture. IMPRESSION: 1. Gastrostomy tube balloon is not inflated. 2. No evidence of obstruction or extraluminal contrast. Electronically signed by: Dorethia Molt MD 03/26/2024 10:13 PM EST RP Workstation: [id number]     .Gastrostomy tube replacement  Date/Time: 03/26/2024 6:30 PM  Performed by: Patsey Lot, MD Authorized by: Patsey Lot, MD  Consent: Verbal consent obtained. Written consent not obtained Risks and benefits: risks, benefits and alternatives were discussed Local anesthesia used: no  Anesthesia: Local anesthesia used: no  Sedation: Patient sedated: no  Patient tolerance: patient tolerated the procedure well with no immediate complications Comments: Malfunctioning tube removed.  New tube placed and 20 cc of saline placed in balloon.  Pulled back on the tube and slid down guard.  Verified placement with contrast and accepted      Medications Ordered in the ED - No data to display                                  Medical Decision Making Amount and/or Complexity of Data Reviewed Radiology: ordered.   Patient with G-tube malfunction.  Replaced without difficulty.  Verified position with x-ray.  X-ray read as possible tube not inflated.  However it had pulled back and did not come out.  Do not think he has leak at this time.  Benign abdominal exam.  Discharge home.     Final diagnoses:  PEG tube malfunction [facility])    ED Discharge Orders     None          Patsey Lot, MD 03/27/24 0008  "

## 2024-03-30 ENCOUNTER — Encounter (HOSPITAL_COMMUNITY)

## 2024-03-30 ENCOUNTER — Encounter (HOSPITAL_COMMUNITY): Payer: Self-pay | Admitting: *Deleted

## 2024-03-30 DIAGNOSIS — J432 Centrilobular emphysema: Secondary | ICD-10-CM

## 2024-03-30 NOTE — Progress Notes (Signed)
 Pulmonary Individual Treatment Plan  Patient Details  Name: Martin White MRN: [id number] Date of Birth: 1955-04-15 Referring Provider:   Flowsheet Row PULMONARY REHAB OTHER RESP ORIENTATION from 02/09/2024 in [facility] CARDIAC REHABILITATION  Referring Provider Erlene Gearing MD    Initial Encounter Date:  Flowsheet Row PULMONARY REHAB OTHER RESP ORIENTATION from 02/09/2024 in [facility]  Date 02/09/24    Visit Diagnosis: Centrilobular emphysema (HCC)  Patient's Home Medications on Admission:  Current Medications[1]  Past Medical History: Past Medical History:  Diagnosis Date   COPD (chronic obstructive pulmonary disease) (HCC)    Degenerative cervical disc    Diabetes mellitus without complication (HCC)    Legally blind     Tobacco Use: Tobacco Use History[2]  Labs: Review Flowsheet        No data to display          Capillary Blood Glucose: No results found for: GLUCAP   Pulmonary Assessment Scores:  Pulmonary Assessment Scores     Row Name 02/09/24 1509         ADL UCSD   ADL Phase Entry       mMRC Score   mMRC Score 4       UCSD: Self-administered rating of dyspnea associated with activities of daily living (ADLs) 6-point scale (0 = not at all to 5 = maximal or unable to do because of breathlessness)  Scoring Scores range from 0 to 120.  Minimally important difference is 5 units  CAT: CAT can identify the health impairment of COPD patients and is better correlated with disease progression.  CAT has a scoring range of zero to 40. The CAT score is classified into four groups of low (less than 10), medium (10 - 20), high (21-30) and very high (31-40) based on the impact level of disease on health status. A CAT score over 10 suggests significant symptoms.  A worsening CAT score could be explained by an exacerbation, poor medication adherence, poor inhaler technique, or progression of COPD or comorbid conditions.   CAT MCID is 2 points  mMRC: mMRC (Modified Medical Research Council) Dyspnea Scale is used to assess the degree of baseline functional disability in patients of respiratory disease due to dyspnea. No minimal important difference is established. A decrease in score of 1 point or greater is considered a positive change.   Pulmonary Function Assessment:   Exercise Target Goals: Exercise Program Goal: Individual exercise prescription set using results from initial 6 min walk test and THRR while considering  patients activity barriers and safety.   Exercise Prescription Goal: Initial exercise prescription builds to 30-45 minutes a day of aerobic activity, 2-3 days per week.  Home exercise guidelines will be given to patient during program as part of exercise prescription that the participant will acknowledge.  Activity Barriers & Risk Stratification:  Activity Barriers & Cardiac Risk Stratification - 02/02/24 1308       Activity Barriers & Cardiac Risk Stratification   Activity Barriers History of Falls;Shortness of Breath;Neck/Spine Problems;Deconditioning;Balance Concerns;Other (comment)    Comments Chronic pain, mostly in back          6 Minute Walk:  6 Minute Walk     Row Name 02/09/24 1451         6 Minute Walk   Phase Initial     Distance 890 feet     Walk Time 6 minutes     # of Rest Breaks 0     MPH 1.68  METS 2.79     RPE 17     Perceived Dyspnea  3     VO2 Peak 9.78     Symptoms Yes (comment)     Comments chronic back and hip pain 10/10, R leg numbness     Resting HR 75 bpm     Resting BP 116/58     Resting Oxygen Saturation  98 %     Exercise Oxygen Saturation  during 6 min walk 97 %     Max Ex. HR 98 bpm     Max Ex. BP 146/74     2 Minute Post BP 132/60       Interval HR   1 Minute HR 95     2 Minute HR 92     3 Minute HR 94     4 Minute HR 94     5 Minute HR 98     6 Minute HR 90     2 Minute Post HR 71     Interval Heart Rate? Yes        Interval Oxygen   Interval Oxygen? Yes     Baseline Oxygen Saturation % 98 %     1 Minute Oxygen Saturation % 97 %     1 Minute Liters of Oxygen 0 L  Room Air     2 Minute Oxygen Saturation % 98 %     2 Minute Liters of Oxygen 0 L     3 Minute Oxygen Saturation % 98 %     3 Minute Liters of Oxygen 0 L     4 Minute Oxygen Saturation % 99 %     4 Minute Liters of Oxygen 0 L     5 Minute Oxygen Saturation % 100 %     5 Minute Liters of Oxygen 0 L     6 Minute Oxygen Saturation % 98 %     6 Minute Liters of Oxygen 0 L     2 Minute Post Oxygen Saturation % 100 %     2 Minute Post Liters of Oxygen 0 L        Oxygen Initial Assessment:  Oxygen Initial Assessment - 02/09/24 1509       Home Oxygen   Home Oxygen Device None    Sleep Oxygen Prescription None    Home Exercise Oxygen Prescription None    Home Resting Oxygen Prescription None      Initial 6 min Walk   Oxygen Used None      Program Oxygen Prescription   Program Oxygen Prescription None      Intervention   Short Term Goals To learn and understand importance of maintaining oxygen saturations>88%;To learn and demonstrate proper pursed lip breathing techniques or other breathing techniques. ;To learn and demonstrate proper use of respiratory medications;To learn and understand importance of monitoring SPO2 with pulse oximeter and demonstrate accurate use of the pulse oximeter.    Long  Term Goals Exhibits proper breathing techniques, such as pursed lip breathing or other method taught during program session;Compliance with respiratory medication;Demonstrates proper use of MDIs;Maintenance of O2 saturations>88%;Verbalizes importance of monitoring SPO2 with pulse oximeter and return demonstration          Oxygen Re-Evaluation:   Oxygen Discharge (Final Oxygen Re-Evaluation):   Initial Exercise Prescription:  Initial Exercise Prescription - 02/09/24 1400       Date of Initial Exercise RX and Referring Provider    Date 02/09/24    Referring  Provider Erlene Gearing MD      Oxygen   Maintain Oxygen Saturation 88% or higher      Treadmill   MPH 0.8    Grade 0    Minutes 15    METs 1.6      NuStep   Level 1    SPM 80    Minutes 15    METs 1.5      REL-XR   Level 1    Speed 50    Minutes 15    METs 1.5      Prescription Details   Frequency (times per week) 2    Duration Progress to 30 minutes of continuous aerobic without signs/symptoms of physical distress      Intensity   THRR 40-80% of Max Heartrate 105-136    Ratings of Perceived Exertion 11-13    Perceived Dyspnea 0-4      Progression   Progression Continue to progress workloads to maintain intensity without signs/symptoms of physical distress.      Resistance Training   Training Prescription Yes    Weight 4 lb    Reps 10-15          Perform Capillary Blood Glucose checks as needed.  Exercise Prescription Changes:   Exercise Prescription Changes     Row Name 02/09/24 1400             Response to Exercise   Blood Pressure (Admit) 116/58       Blood Pressure (Exercise) 146/74       Blood Pressure (Exit) 118/66       Heart Rate (Admit) 75 bpm       Heart Rate (Exercise) 98 bpm       Heart Rate (Exit) 72 bpm       Oxygen Saturation (Admit) 98 %       Oxygen Saturation (Exercise) 97 %       Oxygen Saturation (Exit) 100 %       Rating of Perceived Exertion (Exercise) 17       Perceived Dyspnea (Exercise) 3       Symptoms chronic back and hip pain 10/10, R leg numbness       Comments walk test results          Exercise Comments:   Exercise Comments     Row Name 02/02/24 1329           Exercise Comments Arther currently doesn't do any exercise at home. He does his ADL's but that's it.          Exercise Goals and Review:   Exercise Goals     Row Name 02/02/24 1329 02/09/24 1456           Exercise Goals   Increase Physical Activity Yes Yes      Intervention Provide advice, education,  support and counseling about physical activity/exercise needs.;Develop an individualized exercise prescription for aerobic and resistive training based on initial evaluation findings, risk stratification, comorbidities and participant's personal goals. Provide advice, education, support and counseling about physical activity/exercise needs.;Develop an individualized exercise prescription for aerobic and resistive training based on initial evaluation findings, risk stratification, comorbidities and participant's personal goals.      Expected Outcomes Short Term: Attend rehab on a regular basis to increase amount of physical activity.;Long Term: Add in home exercise to make exercise part of routine and to increase amount of physical activity.;Long Term: Exercising regularly at least 3-5 days a week. Short Term: Attend rehab  on a regular basis to increase amount of physical activity.;Long Term: Add in home exercise to make exercise part of routine and to increase amount of physical activity.;Long Term: Exercising regularly at least 3-5 days a week.      Increase Strength and Stamina Yes Yes      Intervention Provide advice, education, support and counseling about physical activity/exercise needs.;Develop an individualized exercise prescription for aerobic and resistive training based on initial evaluation findings, risk stratification, comorbidities and participant's personal goals. Provide advice, education, support and counseling about physical activity/exercise needs.;Develop an individualized exercise prescription for aerobic and resistive training based on initial evaluation findings, risk stratification, comorbidities and participant's personal goals.      Expected Outcomes Short Term: Increase workloads from initial exercise prescription for resistance, speed, and METs.;Short Term: Perform resistance training exercises routinely during rehab and add in resistance training at home;Long Term: Improve  cardiorespiratory fitness, muscular endurance and strength as measured by increased METs and functional capacity ( ) Short Term: Increase workloads from initial exercise prescription for resistance, speed, and METs.;Short Term: Perform resistance training exercises routinely during rehab and add in resistance training at home;Long Term: Improve cardiorespiratory fitness, muscular endurance and strength as measured by increased METs and functional capacity ( )      Able to understand and use rate of perceived exertion (RPE) scale Yes Yes      Intervention Provide education and explanation on how to use RPE scale Provide education and explanation on how to use RPE scale      Expected Outcomes Short Term: Able to use RPE daily in rehab to express subjective intensity level;Long Term:  Able to use RPE to guide intensity level when exercising independently Short Term: Able to use RPE daily in rehab to express subjective intensity level;Long Term:  Able to use RPE to guide intensity level when exercising independently      Able to understand and use Dyspnea scale Yes Yes      Intervention Provide education and explanation on how to use Dyspnea scale Provide education and explanation on how to use Dyspnea scale      Expected Outcomes Short Term: Able to use Dyspnea scale daily in rehab to express subjective sense of shortness of breath during exertion;Long Term: Able to use Dyspnea scale to guide intensity level when exercising independently Short Term: Able to use Dyspnea scale daily in rehab to express subjective sense of shortness of breath during exertion;Long Term: Able to use Dyspnea scale to guide intensity level when exercising independently      Knowledge and understanding of Target Heart Rate Range (THRR) Yes Yes      Intervention Provide education and explanation of THRR including how the numbers were predicted and where they are located for reference Provide education and explanation of THRR  including how the numbers were predicted and where they are located for reference      Expected Outcomes Short Term: Able to state/look up THRR;Short Term: Able to use daily as guideline for intensity in rehab;Long Term: Able to use THRR to govern intensity when exercising independently Short Term: Able to state/look up THRR;Short Term: Able to use daily as guideline for intensity in rehab;Long Term: Able to use THRR to govern intensity when exercising independently      Able to check pulse independently Yes Yes      Intervention Provide education and demonstration on how to check pulse in carotid and radial arteries.;Review the importance of being able to check your own pulse  for safety during independent exercise Provide education and demonstration on how to check pulse in carotid and radial arteries.;Review the importance of being able to check your own pulse for safety during independent exercise      Expected Outcomes Short Term: Able to explain why pulse checking is important during independent exercise;Long Term: Able to check pulse independently and accurately Short Term: Able to explain why pulse checking is important during independent exercise;Long Term: Able to check pulse independently and accurately      Understanding of Exercise Prescription Yes Yes      Intervention Provide education, explanation, and written materials on patient's individual exercise prescription Provide education, explanation, and written materials on patient's individual exercise prescription      Expected Outcomes Long Term: Able to explain home exercise prescription to exercise independently;Short Term: Able to explain program exercise prescription Long Term: Able to explain home exercise prescription to exercise independently;Short Term: Able to explain program exercise prescription         Exercise Goals Re-Evaluation :   Discharge Exercise Prescription (Final Exercise Prescription Changes):  Exercise  Prescription Changes - 02/09/24 1400       Response to Exercise   Blood Pressure (Admit) 116/58    Blood Pressure (Exercise) 146/74    Blood Pressure (Exit) 118/66    Heart Rate (Admit) 75 bpm    Heart Rate (Exercise) 98 bpm    Heart Rate (Exit) 72 bpm    Oxygen Saturation (Admit) 98 %    Oxygen Saturation (Exercise) 97 %    Oxygen Saturation (Exit) 100 %    Rating of Perceived Exertion (Exercise) 17    Perceived Dyspnea (Exercise) 3    Symptoms chronic back and hip pain 10/10, R leg numbness    Comments walk test results          Nutrition:  Target Goals: Understanding of nutrition guidelines, daily intake of sodium 1500mg , cholesterol 200mg , calories 30% from fat and 7% or less from saturated fats, daily to have 5 or more servings of fruits and vegetables.  Biometrics:  Pre Biometrics - 02/09/24 1457       Pre Biometrics   Height 5' 7 (1.702 m)    Weight 124 lb 4.8 oz (56.4 kg)    Waist Circumference 31 inches    Hip Circumference 36 inches    Waist to Hip Ratio 0.86 %    BMI (Calculated) 19.46    Grip Strength 28.7 kg    Single Leg Stand 3.7 seconds           Nutrition Therapy Plan and Nutrition Goals:  Nutrition Therapy & Goals - 02/02/24 1333       Intervention Plan   Intervention Prescribe, educate and counsel regarding individualized specific dietary modifications aiming towards targeted core components such as weight, hypertension, lipid management, diabetes, heart failure and other comorbidities.;Nutrition handout(s) given to patient.    Expected Outcomes Short Term Goal: Understand basic principles of dietary content, such as calories, fat, sodium, cholesterol and nutrients.;Short Term Goal: A plan has been developed with personal nutrition goals set during dietitian appointment.;Long Term Goal: Adherence to prescribed nutrition plan.          Nutrition Assessments:  MEDIFICTS Score Key: >=70 Need to make dietary changes  40-70 Heart Healthy  Diet <= 40 Therapeutic Level Cholesterol Diet  Flowsheet Row PULMONARY REHAB OTHER RESP ORIENTATION from 02/09/2024 in [facility] CARDIAC REHABILITATION  Picture Your Plate Total Score on Admission 63   Picture Your  Plate Scores: <59 Unhealthy dietary pattern with much room for improvement. 41-50 Dietary pattern unlikely to meet recommendations for good health and room for improvement. 51-60 More healthful dietary pattern, with some room for improvement.  >60 Healthy dietary pattern, although there may be some specific behaviors that could be improved.    Nutrition Goals Re-Evaluation:   Nutrition Goals Discharge (Final Nutrition Goals Re-Evaluation):   Psychosocial: Target Goals: Acknowledge presence or absence of significant depression and/or stress, maximize coping skills, provide positive support system. Participant is able to verbalize types and ability to use techniques and skills needed for reducing stress and depression.  Initial Review & Psychosocial Screening:  Initial Psych Review & Screening - 02/02/24 1334       Initial Review   Current issues with Current Stress Concerns    Source of Stress Concerns Chronic Illness    Comments Tariq has a lot of co-morbidities that has caused him to be stressed. He states the last 2 years have been rough on him.      Family Dynamics   Good Support System? Yes    Comments Kevaughn has his friend Sari who is also is medical POA.      Barriers   Psychosocial barriers to participate in program The patient should benefit from training in stress management and relaxation.      Screening Interventions   Interventions Encouraged to exercise    Expected Outcomes Short Term goal: Utilizing psychosocial counselor, staff and physician to assist with identification of specific Stressors or current issues interfering with healing process. Setting desired goal for each stressor or current issue identified.;Long Term Goal: Stressors or current  issues are controlled or eliminated.;Short Term goal: Identification and review with participant of any Quality of Life or Depression concerns found by scoring the questionnaire.;Long Term goal: The participant improves quality of Life and PHQ9 Scores as seen by post scores and/or verbalization of changes          Quality of Life Scores:  Scores of 19 and below usually indicate a poorer quality of life in these areas.  A difference of  2-3 points is a clinically meaningful difference.  A difference of 2-3 points in the total score of the Quality of Life Index has been associated with significant improvement in overall quality of life, self-image, physical symptoms, and general health in studies assessing change in quality of life.   PHQ-9: Review Flowsheet       02/09/2024  Depression screen PHQ 2/9  Decreased Interest 3  Down, Depressed, Hopeless 2  PHQ - 2 Score 5  Altered sleeping 1  Tired, decreased energy 3  Change in appetite 3  Feeling bad or failure about yourself  1  Trouble concentrating 1  Moving slowly or fidgety/restless 1  Suicidal thoughts 0  PHQ-9 Score 15  Difficult doing work/chores Very difficult   Interpretation of Total Score  Total Score Depression Severity:  1-4 = Minimal depression, 5-9 = Mild depression, 10-14 = Moderate depression, 15-19 = Moderately severe depression, 20-27 = Severe depression   Psychosocial Evaluation and Intervention:  Psychosocial Evaluation - 02/02/24 1335       Psychosocial Evaluation & Interventions   Interventions Stress management education;Relaxation education;Encouraged to exercise with the program and follow exercise prescription    Comments Chuck is a pleasant 68 year old male who is coming into pulmonary rehab for emphysema. Ellery has a lot of co-morbities that contributes to his illness such as COPD, cirrhosis, chronic pain, a feeding  tube and being legally blind. He states he has had multiple falls in the last year  with no injury. He uses a cane to get around. He also states he uses a magnifying glass to help him read. He states he drives some to familiar places, but he will have his friend Sari drive him. His friend Sari is also his medical POA. He currently doesn't do any exercise at home, and he gets dizzy and lightheaded sometimes. He is eager to start the program to help him get better.    Expected Outcomes Short: Help SOB. Long: Get better and back to himself.    Continue Psychosocial Services  Follow up required by staff          Psychosocial Re-Evaluation:   Psychosocial Discharge (Final Psychosocial Re-Evaluation):    Education: Education Goals: Education classes will be provided on a weekly basis, covering required topics. Participant will state understanding/return demonstration of topics presented.  Learning Barriers/Preferences:  Learning Barriers/Preferences - 02/02/24 1333       Learning Barriers/Preferences   Learning Barriers Sight   legally blind   Learning Preferences Audio;Group Instruction;Individual Instruction;Pictoral;Skilled Demonstration          Education Topics: How Lungs Work and Diseases: - Discuss the anatomy of the lungs and diseases that can affect the lungs, such as COPD.   Exercise: -Discuss the importance of exercise, FITT principles of exercise, normal and abnormal responses to exercise, and how to exercise safely.   Environmental Irritants: -Discuss types of environmental irritants and how to limit exposure to environmental irritants.   Meds/Inhalers and oxygen: - Discuss respiratory medications, definition of an inhaler and oxygen, and the proper way to use an inhaler and oxygen.   Energy Saving Techniques: - Discuss methods to conserve energy and decrease shortness of breath when performing activities of daily living.    Bronchial Hygiene / Breathing Techniques: - Discuss breathing mechanics, pursed-lip breathing technique,  proper  posture, effective ways to clear airways, and other functional breathing techniques   Cleaning Equipment: - Provides group verbal and written instruction about the health risks of elevated stress, cause of high stress, and healthy ways to reduce stress.   Nutrition I: Fats: - Discuss the types of cholesterol, what cholesterol does to the body, and how cholesterol levels can be controlled.   Nutrition II: Labels: -Discuss the different components of food labels and how to read food labels.   Respiratory Infections: - Discuss the signs and symptoms of respiratory infections, ways to prevent respiratory infections, and the importance of seeking medical treatment when having a respiratory infection.   Stress I: Signs and Symptoms: - Discuss the causes of stress, how stress may lead to anxiety and depression, and ways to limit stress.   Stress II: Relaxation: -Discuss relaxation techniques to limit stress.   Oxygen for Home/Travel: - Discuss how to prepare for travel when on oxygen and proper ways to transport and store oxygen to ensure safety.   Knowledge Questionnaire Score:   Core Components/Risk Factors/Patient Goals at Admission:  Personal Goals and Risk Factors at Admission - 02/09/24 1504       Core Components/Risk Factors/Patient Goals on Admission    Weight Management Yes;Weight Gain    Intervention Weight Management: Develop a combined nutrition and exercise program designed to reach desired caloric intake, while maintaining appropriate intake of nutrient and fiber, sodium and fats, and appropriate energy expenditure required for the weight goal.;Weight Management: Provide education and appropriate resources to help participant work  on and attain dietary goals.    Admit Weight 124 lb 4.8 oz (56.4 kg)    Goal Weight: Short Term 128 lb (58.1 kg)    Goal Weight: Long Term 130 lb (59 kg)    Expected Outcomes Short Term: Continue to assess and modify interventions until  short term weight is achieved;Long Term: Adherence to nutrition and physical activity/exercise program aimed toward attainment of established weight goal;Weight Gain: Understanding of general recommendations for a high calorie, high protein meal plan that promotes weight gain by distributing calorie intake throughout the day with the consumption for 4-5 meals, snacks, and/or supplements    Improve shortness of breath with ADL's Yes    Intervention Provide education, individualized exercise plan and daily activity instruction to help decrease symptoms of SOB with activities of daily living.    Expected Outcomes Short Term: Improve cardiorespiratory fitness to achieve a reduction of symptoms when performing ADLs;Long Term: Be able to perform more ADLs without symptoms or delay the onset of symptoms    Increase knowledge of respiratory medications and ability to use respiratory devices properly  Yes    Intervention Provide education and demonstration as needed of appropriate use of medications, inhalers, and oxygen therapy.    Expected Outcomes Short Term: Achieves understanding of medications use. Understands that oxygen is a medication prescribed by physician. Demonstrates appropriate use of inhaler and oxygen therapy.;Long Term: Maintain appropriate use of medications, inhalers, and oxygen therapy.    Stress Yes    Intervention Offer individual and/or small group education and counseling on adjustment to heart disease, stress management and health-related lifestyle change. Teach and support self-help strategies.;Refer participants experiencing significant psychosocial distress to appropriate mental health specialists for further evaluation and treatment. When possible, include family members and significant others in education/counseling sessions.    Expected Outcomes Short Term: Participant demonstrates changes in health-related behavior, relaxation and other stress management skills, ability to obtain  effective social support, and compliance with psychotropic medications if prescribed.;Long Term: Emotional wellbeing is indicated by absence of clinically significant psychosocial distress or social isolation.          Core Components/Risk Factors/Patient Goals Review:    Core Components/Risk Factors/Patient Goals at Discharge (Final Review):    ITP Comments:  ITP Comments     Row Name 02/02/24 1343 02/09/24 1448 03/02/24 1104 03/09/24 1349 03/30/24 1616   ITP Comments Completed virtual orientation today.  EP evaluation is scheduled for 02/09/24 at 1:00 .  Documentation for diagnosis can be found in [facility] encounter care everywhere, [facility]. Patient attend orientation today.  Patient is attending Pulmonary Rehabilitation Program.  Documentation for diagnosis can be found in [facility] 01/01/2024.  Reviewed medical chart, RPE/RPD, gym safety, and program guidelines.  Patient was fitted to equipment they will be using during rehab.  Patient is scheduled to start exercise on Tuesday March 02, 2024.   Initial ITP created and sent for review and signature by Dr. Anton Kelp, Medical Director for Pulmonary Rehabilitation Program. 30 day review completed. ITP sent to Dr.Jehanzeb Memon, Medical Director of  Pulmonary Rehab. Continue with ITP unless changes are made by physician.  Has yet to start exercise due to other appt.  Scheduled to start this afternoon. Called patient back.  He is having a hard time affording the program.  He has gotten approval for Medicaid and is hoping it will help pay. We will look into his insurance again to see what his cost would look like. 30 day review completed. ITP  sent to Dr.Jehanzeb Memon, Medical Director of  Pulmonary Rehab. Continue with ITP unless changes are made by physician.  Still trying to decide about his insurance, last attended on 02/09/24      Comments: 30 day review      [1]  Current Outpatient Medications:    albuterol (ACCUNEB) 0.63 MG/3ML  nebulizer solution, Inhale 0.63 mg into the lungs., Disp: , Rfl:    albuterol (ACCUNEB) 1.25 MG/3ML nebulizer solution, Inhale 1.25 mg into the lungs., Disp: , Rfl:    albuterol (VENTOLIN HFA) 108 (90 Base) MCG/ACT inhaler, Inhale 2 puffs into the lungs every 6 (six) hours as needed., Disp: , Rfl:    ammonium lactate (AMLACTIN) 12 % cream, Apply 1 Application topically as needed., Disp: , Rfl:    ascorbic acid (VITAMIN C) 1000 MG tablet, Take 1,000 mg by mouth daily., Disp: , Rfl:    B Complex Vitamins (VITAMIN B-COMPLEX) TABS, Take 1 tablet by mouth daily., Disp: , Rfl:    Carboxymethylcellulose Sod PF 1 % GEL, 1 drop., Disp: , Rfl:    Cholecalciferol 125 MCG (5000 UT) TABS, Take 5,000 Units by mouth., Disp: , Rfl:    diclofenac Sodium (VOLTAREN) 1 % GEL, Apply 2 g topically 4 (four) times daily., Disp: , Rfl:    gabapentin (NEURONTIN) 600 MG tablet, Take 600 mg by mouth as needed., Disp: , Rfl:    ibuprofen (ADVIL) 200 MG tablet, Take 800 mg by mouth., Disp: , Rfl:    megestrol (MEGACE) 20 MG tablet, Take 40 mg by mouth 4 (four) times daily., Disp: , Rfl:    mirtazapine (REMERON) 7.5 MG tablet, Take 7.5 mg by mouth., Disp: , Rfl:    olopatadine (PATANOL) 0.1 % ophthalmic solution, Apply 1 drop to eye., Disp: , Rfl:    Olopatadine HCl 0.2 % SOLN, 1 drop., Disp: , Rfl:    omega-3 fish oil (MAXEPA) 1000 MG CAPS capsule, Take 1 capsule by mouth daily., Disp: , Rfl:    oxyCODONE  (ROXICODONE ) 15 MG immediate release tablet, Take 15 mg by mouth every 6 (six) hours as needed., Disp: , Rfl:    potassium chloride SA (KLOR-CON M) 20 MEQ tablet, Take 20 mEq by mouth 2 (two) times daily., Disp: , Rfl:    TRELEGY ELLIPTA 100-62.5-25 MCG/ACT AEPB, Inhale 1 puff into the lungs daily., Disp: , Rfl:  [2]  Social History Tobacco Use  Smoking Status Some Days  Smokeless Tobacco Never

## 2024-04-06 ENCOUNTER — Telehealth (HOSPITAL_COMMUNITY): Payer: Self-pay

## 2024-04-06 ENCOUNTER — Encounter (HOSPITAL_COMMUNITY)

## 2024-04-06 NOTE — Progress Notes (Signed)
 [facility] Worker Note  Contact made with: Patient Contacts     Contact Date/Time Type Contact Phone/Fax   04/06/2024 10:00 AM EST Phone (Outgoing) Joyce, Leckey (Self) [phone] (H)   Left Message       CHW Visit indicated: No  Home Visit Appointment Scheduled: No  Notes:   CHW called patient and left a voicemail with my contact information requesting a return call CHW will reach back out   Electronically signed by: Rolin GORMAN Serge, CHWorker 04/06/2024 10:01 AM

## 2024-04-06 NOTE — Telephone Encounter (Signed)
 Called patient to inform about insurance information and it's relation to cardiac rehab. Pt didn't answer, left a voicemail.

## 2024-04-08 ENCOUNTER — Encounter (HOSPITAL_COMMUNITY)

## 2024-04-08 DIAGNOSIS — J432 Centrilobular emphysema: Secondary | ICD-10-CM | POA: Insufficient documentation

## 2024-04-13 ENCOUNTER — Encounter (HOSPITAL_COMMUNITY)

## 2024-04-15 ENCOUNTER — Encounter (HOSPITAL_COMMUNITY)

## 2024-04-20 ENCOUNTER — Encounter (HOSPITAL_COMMUNITY)

## 2024-04-22 ENCOUNTER — Encounter (HOSPITAL_COMMUNITY)

## 2024-04-27 ENCOUNTER — Encounter (HOSPITAL_COMMUNITY): Payer: Self-pay

## 2024-04-27 ENCOUNTER — Encounter (HOSPITAL_COMMUNITY)

## 2024-04-27 DIAGNOSIS — J432 Centrilobular emphysema: Secondary | ICD-10-CM

## 2024-04-27 NOTE — Progress Notes (Signed)
 Pulmonary Individual Treatment Plan  Patient Details  Name: Martin White MRN: [id number] Date of Birth: [birth year] Referring Provider:   Flowsheet Row PULMONARY REHAB OTHER RESP ORIENTATION from 02/09/2024 in [facility] CARDIAC REHABILITATION  Referring Provider Erlene Gearing MD    Initial Encounter Date:  Flowsheet Row PULMONARY REHAB OTHER RESP ORIENTATION from 02/09/2024 in [facility]  Date 02/09/24    Visit Diagnosis: Centrilobular emphysema (HCC)  Patient's Home Medications on Admission: Current Medications[1]  Past Medical History: Past Medical History:  Diagnosis Date   COPD (chronic obstructive pulmonary disease) (HCC)    Degenerative cervical disc    Diabetes mellitus without complication (HCC)    Legally blind     Tobacco Use: Tobacco Use History[2]  Labs: Review Flowsheet        No data to display           Pulmonary Assessment Scores:  Pulmonary Assessment Scores     Row Name 02/09/24 1509         ADL UCSD   ADL Phase Entry       mMRC Score   mMRC Score 4        UCSD: Self-administered rating of dyspnea associated with activities of daily living (ADLs) 6-point scale (0 = not at all to 5 = maximal or unable to do because of breathlessness)  Scoring Scores range from 0 to 120.  Minimally important difference is 5 units  CAT: CAT can identify the health impairment of COPD patients and is better correlated with disease progression.  CAT has a scoring range of zero to 40. The CAT score is classified into four groups of low (less than 10), medium (10 - 20), high (21-30) and very high (31-40) based on the impact level of disease on health status. A CAT score over 10 suggests significant symptoms.  A worsening CAT score could be explained by an exacerbation, poor medication adherence, poor inhaler technique, or progression of COPD or comorbid conditions.  CAT MCID is 2 points  mMRC: mMRC (Modified Medical  Research Council) Dyspnea Scale is used to assess the degree of baseline functional disability in patients of respiratory disease due to dyspnea. No minimal important difference is established. A decrease in score of 1 point or greater is considered a positive change.   Pulmonary Function Assessment:   Exercise Target Goals: Exercise Program Goal: Individual exercise prescription set using results from initial 6 min walk test and THRR while considering  patients activity barriers and safety.   Exercise Prescription Goal: Initial exercise prescription builds to 30-45 minutes a day of aerobic activity, 2-3 days per week.  Home exercise guidelines will be given to patient during program as part of exercise prescription that the participant will acknowledge.  Education: Aerobic Exercise: - Group verbal and visual presentation on the components of exercise prescription. Introduces F.I.T.T principle from ACSM for exercise prescriptions.  Reviews F.I.T.T. principles of aerobic exercise including progression. Written material provided at class time.   Education: Resistance Exercise: - Group verbal and visual presentation on the components of exercise prescription. Introduces F.I.T.T principle from ACSM for exercise prescriptions  Reviews F.I.T.T. principles of resistance exercise including progression. Written material provided at class time.    Education: Exercise & Equipment Safety: - Individual verbal instruction and demonstration of equipment use and safety with use of the equipment.   Education: Exercise Physiology & General Exercise Guidelines: - Group verbal and written instruction with models to review the exercise physiology of the cardiovascular  system and associated critical values. Provides general exercise guidelines with specific guidelines to those with heart or lung disease.    Education: Flexibility, Balance, Mind/Body Relaxation: - Group verbal and visual presentation with  interactive activity on the components of exercise prescription. Introduces F.I.T.T principle from ACSM for exercise prescriptions. Reviews F.I.T.T. principles of flexibility and balance exercise training including progression. Also discusses the mind body connection.  Reviews various relaxation techniques to help reduce and manage stress (i.e. Deep breathing, progressive muscle relaxation, and visualization). Balance handout provided to take home. Written material provided at class time.   Activity Barriers & Risk Stratification:  Activity Barriers & Cardiac Risk Stratification - 02/02/24 1308       Activity Barriers & Cardiac Risk Stratification   Activity Barriers History of Falls;Shortness of Breath;Neck/Spine Problems;Deconditioning;Balance Concerns;Other (comment)    Comments Chronic pain, mostly in back          6 Minute Walk:  6 Minute Walk     Row Name 02/09/24 1451         6 Minute Walk   Phase Initial     Distance 890 feet     Walk Time 6 minutes     # of Rest Breaks 0     MPH 1.68     METS 2.79     RPE 17     Perceived Dyspnea  3     VO2 Peak 9.78     Symptoms Yes (comment)     Comments chronic back and hip pain 10/10, R leg numbness     Resting HR 75 bpm     Resting BP 116/58     Resting Oxygen Saturation  98 %     Exercise Oxygen Saturation  during 6 min walk 97 %     Max Ex. HR 98 bpm     Max Ex. BP 146/74     2 Minute Post BP 132/60       Interval HR   1 Minute HR 95     2 Minute HR 92     3 Minute HR 94     4 Minute HR 94     5 Minute HR 98     6 Minute HR 90     2 Minute Post HR 71     Interval Heart Rate? Yes       Interval Oxygen   Interval Oxygen? Yes     Baseline Oxygen Saturation % 98 %     1 Minute Oxygen Saturation % 97 %     1 Minute Liters of Oxygen 0 L  Room Air     2 Minute Oxygen Saturation % 98 %     2 Minute Liters of Oxygen 0 L     3 Minute Oxygen Saturation % 98 %     3 Minute Liters of Oxygen 0 L     4 Minute Oxygen  Saturation % 99 %     4 Minute Liters of Oxygen 0 L     5 Minute Oxygen Saturation % 100 %     5 Minute Liters of Oxygen 0 L     6 Minute Oxygen Saturation % 98 %     6 Minute Liters of Oxygen 0 L     2 Minute Post Oxygen Saturation % 100 %     2 Minute Post Liters of Oxygen 0 L       Oxygen Initial Assessment:  Oxygen Initial Assessment - 02/09/24  1509       Home Oxygen   Home Oxygen Device None    Sleep Oxygen Prescription None    Home Exercise Oxygen Prescription None    Home Resting Oxygen Prescription None      Initial 6 min Walk   Oxygen Used None      Program Oxygen Prescription   Program Oxygen Prescription None      Intervention   Short Term Goals To learn and understand importance of maintaining oxygen saturations>88%;To learn and demonstrate proper pursed lip breathing techniques or other breathing techniques. ;To learn and demonstrate proper use of respiratory medications;To learn and understand importance of monitoring SPO2 with pulse oximeter and demonstrate accurate use of the pulse oximeter.    Long  Term Goals Exhibits proper breathing techniques, such as pursed lip breathing or other method taught during program session;Compliance with respiratory medication;Demonstrates proper use of MDIs;Maintenance of O2 saturations>88%;Verbalizes importance of monitoring SPO2 with pulse oximeter and return demonstration          Oxygen Re-Evaluation:   Oxygen Discharge (Final Oxygen Re-Evaluation):   Initial Exercise Prescription:  Initial Exercise Prescription - 02/09/24 1400       Date of Initial Exercise RX and Referring Provider   Date 02/09/24    Referring Provider Erlene Gearing MD      Oxygen   Maintain Oxygen Saturation 88% or higher      Treadmill   MPH 0.8    Grade 0    Minutes 15    METs 1.6      NuStep   Level 1    SPM 80    Minutes 15    METs 1.5      REL-XR   Level 1    Speed 50    Minutes 15    METs 1.5      Prescription  Details   Frequency (times per week) 2    Duration Progress to 30 minutes of continuous aerobic without signs/symptoms of physical distress      Intensity   THRR 40-80% of Max Heartrate 105-136    Ratings of Perceived Exertion 11-13    Perceived Dyspnea 0-4      Progression   Progression Continue to progress workloads to maintain intensity without signs/symptoms of physical distress.      Resistance Training   Training Prescription Yes    Weight 4 lb    Reps 10-15          Perform Capillary Blood Glucose checks as needed.  Exercise Prescription Changes:   Exercise Prescription Changes     Row Name 02/09/24 1400             Response to Exercise   Blood Pressure (Admit) 116/58       Blood Pressure (Exercise) 146/74       Blood Pressure (Exit) 118/66       Heart Rate (Admit) 75 bpm       Heart Rate (Exercise) 98 bpm       Heart Rate (Exit) 72 bpm       Oxygen Saturation (Admit) 98 %       Oxygen Saturation (Exercise) 97 %       Oxygen Saturation (Exit) 100 %       Rating of Perceived Exertion (Exercise) 17       Perceived Dyspnea (Exercise) 3       Symptoms chronic back and hip pain 10/10, R leg numbness  Comments walk test results          Exercise Comments:   Exercise Comments     Row Name 02/02/24 1329           Exercise Comments Iren currently doesn't do any exercise at home. He does his ADL's but that's it.          Exercise Goals and Review:   Exercise Goals     Row Name 02/02/24 1329 02/09/24 1456           Exercise Goals   Increase Physical Activity Yes Yes      Intervention Provide advice, education, support and counseling about physical activity/exercise needs.;Develop an individualized exercise prescription for aerobic and resistive training based on initial evaluation findings, risk stratification, comorbidities and participant's personal goals. Provide advice, education, support and counseling about physical activity/exercise  needs.;Develop an individualized exercise prescription for aerobic and resistive training based on initial evaluation findings, risk stratification, comorbidities and participant's personal goals.      Expected Outcomes Short Term: Attend rehab on a regular basis to increase amount of physical activity.;Long Term: Add in home exercise to make exercise part of routine and to increase amount of physical activity.;Long Term: Exercising regularly at least 3-5 days a week. Short Term: Attend rehab on a regular basis to increase amount of physical activity.;Long Term: Add in home exercise to make exercise part of routine and to increase amount of physical activity.;Long Term: Exercising regularly at least 3-5 days a week.      Increase Strength and Stamina Yes Yes      Intervention Provide advice, education, support and counseling about physical activity/exercise needs.;Develop an individualized exercise prescription for aerobic and resistive training based on initial evaluation findings, risk stratification, comorbidities and participant's personal goals. Provide advice, education, support and counseling about physical activity/exercise needs.;Develop an individualized exercise prescription for aerobic and resistive training based on initial evaluation findings, risk stratification, comorbidities and participant's personal goals.      Expected Outcomes Short Term: Increase workloads from initial exercise prescription for resistance, speed, and METs.;Short Term: Perform resistance training exercises routinely during rehab and add in resistance training at home;Long Term: Improve cardiorespiratory fitness, muscular endurance and strength as measured by increased METs and functional capacity ( ) Short Term: Increase workloads from initial exercise prescription for resistance, speed, and METs.;Short Term: Perform resistance training exercises routinely during rehab and add in resistance training at home;Long Term:  Improve cardiorespiratory fitness, muscular endurance and strength as measured by increased METs and functional capacity ( )      Able to understand and use rate of perceived exertion (RPE) scale Yes Yes      Intervention Provide education and explanation on how to use RPE scale Provide education and explanation on how to use RPE scale      Expected Outcomes Short Term: Able to use RPE daily in rehab to express subjective intensity level;Long Term:  Able to use RPE to guide intensity level when exercising independently Short Term: Able to use RPE daily in rehab to express subjective intensity level;Long Term:  Able to use RPE to guide intensity level when exercising independently      Able to understand and use Dyspnea scale Yes Yes      Intervention Provide education and explanation on how to use Dyspnea scale Provide education and explanation on how to use Dyspnea scale      Expected Outcomes Short Term: Able to use Dyspnea scale daily in rehab to express subjective  sense of shortness of breath during exertion;Long Term: Able to use Dyspnea scale to guide intensity level when exercising independently Short Term: Able to use Dyspnea scale daily in rehab to express subjective sense of shortness of breath during exertion;Long Term: Able to use Dyspnea scale to guide intensity level when exercising independently      Knowledge and understanding of Target Heart Rate Range (THRR) Yes Yes      Intervention Provide education and explanation of THRR including how the numbers were predicted and where they are located for reference Provide education and explanation of THRR including how the numbers were predicted and where they are located for reference      Expected Outcomes Short Term: Able to state/look up THRR;Short Term: Able to use daily as guideline for intensity in rehab;Long Term: Able to use THRR to govern intensity when exercising independently Short Term: Able to state/look up THRR;Short Term: Able to  use daily as guideline for intensity in rehab;Long Term: Able to use THRR to govern intensity when exercising independently      Able to check pulse independently Yes Yes      Intervention Provide education and demonstration on how to check pulse in carotid and radial arteries.;Review the importance of being able to check your own pulse for safety during independent exercise Provide education and demonstration on how to check pulse in carotid and radial arteries.;Review the importance of being able to check your own pulse for safety during independent exercise      Expected Outcomes Short Term: Able to explain why pulse checking is important during independent exercise;Long Term: Able to check pulse independently and accurately Short Term: Able to explain why pulse checking is important during independent exercise;Long Term: Able to check pulse independently and accurately      Understanding of Exercise Prescription Yes Yes      Intervention Provide education, explanation, and written materials on patient's individual exercise prescription Provide education, explanation, and written materials on patient's individual exercise prescription      Expected Outcomes Long Term: Able to explain home exercise prescription to exercise independently;Short Term: Able to explain program exercise prescription Long Term: Able to explain home exercise prescription to exercise independently;Short Term: Able to explain program exercise prescription         Exercise Goals Re-Evaluation :   Discharge Exercise Prescription (Final Exercise Prescription Changes):  Exercise Prescription Changes - 02/09/24 1400       Response to Exercise   Blood Pressure (Admit) 116/58    Blood Pressure (Exercise) 146/74    Blood Pressure (Exit) 118/66    Heart Rate (Admit) 75 bpm    Heart Rate (Exercise) 98 bpm    Heart Rate (Exit) 72 bpm    Oxygen Saturation (Admit) 98 %    Oxygen Saturation (Exercise) 97 %    Oxygen Saturation  (Exit) 100 %    Rating of Perceived Exertion (Exercise) 17    Perceived Dyspnea (Exercise) 3    Symptoms chronic back and hip pain 10/10, R leg numbness    Comments walk test results          Nutrition:  Target Goals: Understanding of nutrition guidelines, daily intake of sodium 1500mg , cholesterol 200mg , calories 30% from fat and 7% or less from saturated fats, daily to have 5 or more servings of fruits and vegetables.  Education: Nutrition 1 -Group instruction provided by verbal, written material, interactive activities, discussions, models, and posters to present general guidelines for heart healthy nutrition including  macronutrients, label reading, and promoting whole foods over processed counterparts. Education serves as pensions consultant of discussion of heart healthy eating for all. Written material provided at class time.     Education: Nutrition 2 -Group instruction provided by verbal, written material, interactive activities, discussions, models, and posters to present general guidelines for heart healthy nutrition including sodium, cholesterol, and saturated fat. Providing guidance of habit forming to improve blood pressure, cholesterol, and body weight. Written material provided at class time.     Biometrics:  Pre Biometrics - 02/09/24 1457       Pre Biometrics   Height 5' 7 (1.702 m)    Weight 124 lb 4.8 oz (56.4 kg)    Waist Circumference 31 inches    Hip Circumference 36 inches    Waist to Hip Ratio 0.86 %    BMI (Calculated) 19.46    Grip Strength 28.7 kg    Single Leg Stand 3.7 seconds           Nutrition Therapy Plan and Nutrition Goals:  Nutrition Therapy & Goals - 02/02/24 1333       Intervention Plan   Intervention Prescribe, educate and counsel regarding individualized specific dietary modifications aiming towards targeted core components such as weight, hypertension, lipid management, diabetes, heart failure and other comorbidities.;Nutrition  handout(s) given to patient.    Expected Outcomes Short Term Goal: Understand basic principles of dietary content, such as calories, fat, sodium, cholesterol and nutrients.;Short Term Goal: A plan has been developed with personal nutrition goals set during dietitian appointment.;Long Term Goal: Adherence to prescribed nutrition plan.          Nutrition Assessments:  MEDIFICTS Score Key: >=70 Need to make dietary changes  40-70 Heart Healthy Diet <= 40 Therapeutic Level Cholesterol Diet  Flowsheet Row PULMONARY REHAB OTHER RESP ORIENTATION from 02/09/2024 in [facility] CARD[facility]AC REHABILITATION  Picture Your Plate Total Score on Admission 63   Picture Your Plate Scores: <59 Unhealthy dietary pattern with much room for improvement. 41-50 Dietary pattern unlikely to meet recommendations for good health and room for improvement. 51-60 More healthful dietary pattern, with some room for improvement.  >60 Healthy dietary pattern, although there may be some specific behaviors that could be improved.   Nutrition Goals Re-Evaluation:   Nutrition Goals Discharge (Final Nutrition Goals Re-Evaluation):   Psychosocial: Target Goals: Acknowledge presence or absence of significant depression and/or stress, maximize coping skills, provide positive support system. Participant is able to verbalize types and ability to use techniques and skills needed for reducing stress and depression.   Education: Stress, Anxiety, and Depression - Group verbal and visual presentation to define topics covered.  Reviews how body is impacted by stress, anxiety, and depression.  Also discusses healthy ways to reduce stress and to treat/manage anxiety and depression.  Written material provided at class time.   Education: Sleep Hygiene -Provides group verbal and written instruction about how sleep can affect your health.  Define sleep hygiene, discuss sleep cycles and impact of sleep habits. Review good sleep hygiene tips.     Initial Review & Psychosocial Screening:  Initial Psych Review & Screening - 02/02/24 1334       Initial Review   Current issues with Current Stress Concerns    Source of Stress Concerns Chronic Illness    Comments Eluterio has a lot of co-morbidities that has caused him to be stressed. He states the last 2 years have been rough on him.      Family Dynamics  Good Support System? Yes    Comments [place] has his friend Sari who is also is medical POA.      Barriers   Psychosocial barriers to participate in program The patient should benefit from training in stress management and relaxation.      Screening Interventions   Interventions Encouraged to exercise    Expected Outcomes Short Term goal: Utilizing psychosocial counselor, staff and physician to assist with identification of specific Stressors or current issues interfering with healing process. Setting desired goal for each stressor or current issue identified.;Long Term Goal: Stressors or current issues are controlled or eliminated.;Short Term goal: Identification and review with participant of any Quality of Life or Depression concerns found by scoring the questionnaire.;Long Term goal: The participant improves quality of Life and PHQ9 Scores as seen by post scores and/or verbalization of changes          Quality of Life Scores:  Scores of 19 and below usually indicate a poorer quality of life in these areas.  A difference of  2-3 points is a clinically meaningful difference.  A difference of 2-3 points in the total score of the Quality of Life Index has been associated with significant improvement in overall quality of life, self-image, physical symptoms, and general health in studies assessing change in quality of life.  PHQ-9: Review Flowsheet       02/09/2024  Depression screen PHQ 2/9  Decreased Interest 3  Down, Depressed, Hopeless 2  PHQ - 2 Score 5  Altered sleeping 1  Tired, decreased energy 3  Change in  appetite 3  Feeling bad or failure about yourself  1  Trouble concentrating 1  Moving slowly or fidgety/restless 1  Suicidal thoughts 0  PHQ-9 Score 15  Difficult doing work/chores Very difficult   Interpretation of Total Score  Total Score Depression Severity:  1-4 = Minimal depression, 5-9 = Mild depression, 10-14 = Moderate depression, 15-19 = Moderately severe depression, 20-27 = Severe depression   Psychosocial Evaluation and Intervention:  Psychosocial Evaluation - 02/02/24 1335       Psychosocial Evaluation & Interventions   Interventions Stress management education;Relaxation education;Encouraged to exercise with the program and follow exercise prescription    Comments Darreld is a pleasant 69 year old male who is coming into pulmonary rehab for emphysema. Phyllis has a lot of co-morbities that contributes to his illness such as COPD, cirrhosis, chronic pain, a feeding tube and being legally blind. He states he has had multiple falls in the last year with no injury. He uses a cane to get around. He also states he uses a magnifying glass to help him read. He states he drives some to familiar places, but he will have his friend Sari drive him. His friend Sari is also his medical POA. He currently doesn't do any exercise at home, and he gets dizzy and lightheaded sometimes. He is eager to start the program to help him get better.    Expected Outcomes Short: Help SOB. Long: Get better and back to himself.    Continue Psychosocial Services  Follow up required by staff          Psychosocial Re-Evaluation:   Psychosocial Discharge (Final Psychosocial Re-Evaluation):   Education: Education Goals: Education classes will be provided on a weekly basis, covering required topics. Participant will state understanding/return demonstration of topics presented.  Learning Barriers/Preferences:  Learning Barriers/Preferences - 02/02/24 1333       Learning Barriers/Preferences    Learning Barriers Sight  legally blind   Learning Preferences Audio;Group Instruction;Individual Instruction;Pictoral;Skilled Demonstration          General Pulmonary Education Topics:  Infection Prevention: - Provides verbal and written material to individual with discussion of infection control including proper hand washing and proper equipment cleaning during exercise session.   Falls Prevention: - Provides verbal and written material to individual with discussion of falls prevention and safety.   Chronic Lung Disease Review: - Group verbal instruction with posters, models, PowerPoint presentations and videos,  to review new updates, new respiratory medications, new advancements in procedures and treatments. Providing information on websites and 800 numbers for continued self-education. Includes information about supplement oxygen, available portable oxygen systems, continuous and intermittent flow rates, oxygen safety, concentrators, and Medicare reimbursement for oxygen. Explanation of Pulmonary Drugs, including class, frequency, complications, importance of spacers, rinsing mouth after steroid MDI's, and proper cleaning methods for nebulizers. Review of basic lung anatomy and physiology related to function, structure, and complications of lung disease. Review of risk factors. Discussion about methods for diagnosing sleep apnea and types of masks and machines for OSA. Includes a review of the use of types of environmental controls: home humidity, furnaces, filters, dust mite/pet prevention, HEPA vacuums. Discussion about weather changes, air quality and the benefits of nasal washing. Instruction on Warning signs, infection symptoms, calling MD promptly, preventive modes, and value of vaccinations. Review of effective airway clearance, coughing and/or vibration techniques. Emphasizing that all should Create an Action Plan. Written material provided at class time.   AED/CPR: - Group  verbal and written instruction with the use of models to demonstrate the basic use of the AED with the basic ABC's of resuscitation.    Tests and Procedures:  - Group verbal and visual presentation and models provide information about basic cardiac anatomy and function. Reviews the testing methods done to diagnose heart disease and the outcomes of the test results. Describes the treatment choices: Medical Management, Angioplasty, or Coronary Bypass Surgery for treating various heart conditions including Myocardial Infarction, Angina, Valve Disease, and Cardiac Arrhythmias.  Written material provided at class time.   Medication Safety: - Group verbal and visual instruction to review commonly prescribed medications for heart and lung disease. Reviews the medication, class of the drug, and side effects. Includes the steps to properly store meds and maintain the prescription regimen.  Written material given at graduation.   Other: -Provides group and verbal instruction on various topics (see comments)   Knowledge Questionnaire Score:    Core Components/Risk Factors/Patient Goals at Admission:  Personal Goals and Risk Factors at Admission - 02/09/24 1504       Core Components/Risk Factors/Patient Goals on Admission    Weight Management Yes;Weight Gain    Intervention Weight Management: Develop a combined nutrition and exercise program designed to reach desired caloric intake, while maintaining appropriate intake of nutrient and fiber, sodium and fats, and appropriate energy expenditure required for the weight goal.;Weight Management: Provide education and appropriate resources to help participant work on and attain dietary goals.    Admit Weight 124 lb 4.8 oz (56.4 kg)    Goal Weight: Short Term 128 lb (58.1 kg)    Goal Weight: Long Term 130 lb (59 kg)    Expected Outcomes Short Term: Continue to assess and modify interventions until short term weight is achieved;Long Term: Adherence to  nutrition and physical activity/exercise program aimed toward attainment of established weight goal;Weight Gain: Understanding of general recommendations for a high calorie, high protein meal plan that  promotes weight gain by distributing calorie intake throughout the day with the consumption for 4-5 meals, snacks, and/or supplements    Improve shortness of breath with ADL's Yes    Intervention Provide education, individualized exercise plan and daily activity instruction to help decrease symptoms of SOB with activities of daily living.    Expected Outcomes Short Term: Improve cardiorespiratory fitness to achieve a reduction of symptoms when performing ADLs;Long Term: Be able to perform more ADLs without symptoms or delay the onset of symptoms    Increase knowledge of respiratory medications and ability to use respiratory devices properly  Yes    Intervention Provide education and demonstration as needed of appropriate use of medications, inhalers, and oxygen therapy.    Expected Outcomes Short Term: Achieves understanding of medications use. Understands that oxygen is a medication prescribed by physician. Demonstrates appropriate use of inhaler and oxygen therapy.;Long Term: Maintain appropriate use of medications, inhalers, and oxygen therapy.    Stress Yes    Intervention Offer individual and/or small group education and counseling on adjustment to heart disease, stress management and health-related lifestyle change. Teach and support self-help strategies.;Refer participants experiencing significant psychosocial distress to appropriate mental health specialists for further evaluation and treatment. When possible, include family members and significant others in education/counseling sessions.    Expected Outcomes Short Term: Participant demonstrates changes in health-related behavior, relaxation and other stress management skills, ability to obtain effective social support, and compliance with  psychotropic medications if prescribed.;Long Term: Emotional wellbeing is indicated by absence of clinically significant psychosocial distress or social isolation.          Education:Diabetes - Individual verbal and written instruction to review signs/symptoms of diabetes, desired ranges of glucose level fasting, after meals and with exercise. Acknowledge that pre and post exercise glucose checks will be done for 3 sessions at entry of program.   Know Your Numbers and Heart Failure: - Group verbal and visual instruction to discuss disease risk factors for cardiac and pulmonary disease and treatment options.  Reviews associated critical values for Overweight/Obesity, Hypertension, Cholesterol, and Diabetes.  Discusses basics of heart failure: signs/symptoms and treatments.  Introduces Heart Failure Zone chart for action plan for heart failure. Written material provided at class time.   Core Components/Risk Factors/Patient Goals Review:    Core Components/Risk Factors/Patient Goals at Discharge (Final Review):    ITP Comments:  ITP Comments     Row Name 02/02/24 1343 02/09/24 1448 03/02/24 1104 03/09/24 1349 03/30/24 1616   ITP Comments Completed virtual orientation today.  EP evaluation is scheduled for 02/09/24 at 1:00 .  Documentation for diagnosis can be found in [facility] encounter care everywhere, [facility]. Patient attend orientation today.  Patient is attending Pulmonary Rehabilitation Program.  Documentation for diagnosis can be found in [facility] 01/01/2024.  Reviewed medical chart, RPE/RPD, gym safety, and program guidelines.  Patient was fitted to equipment they will be using during rehab.  Patient is scheduled to start exercise on Tuesday March 02, 2024.   Initial ITP created and sent for review and signature by Dr. Anton Kelp, Medical Director for Pulmonary Rehabilitation Program. 30 day review completed. ITP sent to Dr.Jehanzeb Memon, Medical Director of  Pulmonary Rehab. Continue  with ITP unless changes are made by physician.  Has yet to start exercise due to other appt.  Scheduled to start this afternoon. Called patient back.  He is having a hard time affording the program.  He has gotten approval for Medicaid and is hoping it will  help pay. We will look into his insurance again to see what his cost would look like. 30 day review completed. ITP sent to Dr.Jehanzeb Memon, Medical Director of  Pulmonary Rehab. Continue with ITP unless changes are made by physician.  Still trying to decide about his insurance, last attended on 02/09/24    Row Name 04/27/24 1009           ITP Comments 30 day review completed. ITP sent to Dr.Jehanzeb Memon, Medical Director of  Pulmonary Rehab. Continue with ITP unless changes are made by physician.  Goals not obtained due to patient attendance. Have called him about starting and no response.          Comments: 30 day review      [1]  Current Outpatient Medications:    albuterol (ACCUNEB) 0.63 MG/3ML nebulizer solution, Inhale 0.63 mg into the lungs., Disp: , Rfl:    albuterol (ACCUNEB) 1.25 MG/3ML nebulizer solution, Inhale 1.25 mg into the lungs., Disp: , Rfl:    albuterol (VENTOLIN HFA) 108 (90 Base) MCG/ACT inhaler, Inhale 2 puffs into the lungs every 6 (six) hours as needed., Disp: , Rfl:    ammonium lactate (AMLACTIN) 12 % cream, Apply 1 Application topically as needed., Disp: , Rfl:    ascorbic acid (VITAMIN C) 1000 MG tablet, Take 1,000 mg by mouth daily., Disp: , Rfl:    B Complex Vitamins (VITAMIN B-COMPLEX) TABS, Take 1 tablet by mouth daily., Disp: , Rfl:    Carboxymethylcellulose Sod PF 1 % GEL, 1 drop., Disp: , Rfl:    Cholecalciferol 125 MCG (5000 UT) TABS, Take 5,000 Units by mouth., Disp: , Rfl:    diclofenac Sodium (VOLTAREN) 1 % GEL, Apply 2 g topically 4 (four) times daily., Disp: , Rfl:    gabapentin (NEURONTIN) 600 MG tablet, Take 600 mg by mouth as needed., Disp: , Rfl:    ibuprofen (ADVIL) 200 MG tablet, Take  800 mg by mouth., Disp: , Rfl:    megestrol (MEGACE) 20 MG tablet, Take 40 mg by mouth 4 (four) times daily., Disp: , Rfl:    mirtazapine (REMERON) 7.5 MG tablet, Take 7.5 mg by mouth., Disp: , Rfl:    olopatadine (PATANOL) 0.1 % ophthalmic solution, Apply 1 drop to eye., Disp: , Rfl:    Olopatadine HCl 0.2 % SOLN, 1 drop., Disp: , Rfl:    omega-3 fish oil (MAXEPA) 1000 MG CAPS capsule, Take 1 capsule by mouth daily., Disp: , Rfl:    oxyCODONE  (ROXICODONE ) 15 MG immediate release tablet, Take 15 mg by mouth every 6 (six) hours as needed., Disp: , Rfl:    potassium chloride SA (KLOR-CON M) 20 MEQ tablet, Take 20 mEq by mouth 2 (two) times daily., Disp: , Rfl:    TRELEGY ELLIPTA 100-62.5-25 MCG/ACT AEPB, Inhale 1 puff into the lungs daily., Disp: , Rfl:  [2]  Social History Tobacco Use  Smoking Status Some Days  Smokeless Tobacco Never

## 2024-04-28 ENCOUNTER — Encounter (HOSPITAL_COMMUNITY): Payer: Self-pay

## 2024-04-28 DIAGNOSIS — J432 Centrilobular emphysema: Secondary | ICD-10-CM

## 2024-04-28 NOTE — Progress Notes (Signed)
 We have called patient back about wanting to knoe his new insurance. He has not returned the phone calls and has not showed up to class. Will be sending patient a letter about discharge.

## 2024-04-29 ENCOUNTER — Encounter (HOSPITAL_COMMUNITY): Payer: Self-pay

## 2024-04-29 ENCOUNTER — Telehealth (HOSPITAL_COMMUNITY): Payer: Self-pay | Admitting: Surgery

## 2024-04-29 ENCOUNTER — Encounter (HOSPITAL_COMMUNITY)
Admission: RE | Admit: 2024-04-29 | Discharge: 2024-04-29 | Disposition: A | Discharge status: Home / Self Care | Source: Ambulatory Visit | Attending: Internal Medicine | Admitting: Internal Medicine

## 2024-04-29 DIAGNOSIS — J432 Centrilobular emphysema: Secondary | ICD-10-CM

## 2024-04-29 NOTE — Progress Notes (Signed)
 Discharge Progress Report  Patient Details  Name: Martin White MRN: [id number] Date of Birth: May 14, 1955 Referring Provider:   Flowsheet Row PULMONARY REHAB OTHER RESP ORIENTATION from 02/09/2024 in [facility] CARDIAC REHABILITATION  Referring Provider Erlene Gearing MD     Number of Visits: 1  Reason for Discharge:  Early Exit:  Lack of attendance  Smoking History:  Tobacco Use History[1]  Diagnosis:  Centrilobular emphysema (HCC)  ADL [facility]:  Pulmonary Assessment Scores     Row Name 02/09/24 1509         ADL UCSD   ADL Phase Entry       mMRC Score   mMRC Score 4        Initial Exercise Prescription:  Initial Exercise Prescription - 02/09/24 1400       Date of Initial Exercise RX and Referring Provider   Date 02/09/24    Referring Provider Erlene Gearing MD      Oxygen   Maintain Oxygen Saturation 88% or higher      Treadmill   MPH 0.8    Grade 0    Minutes 15    METs 1.6      NuStep   Level 1    SPM 80    Minutes 15    METs 1.5      REL-XR   Level 1    Speed 50    Minutes 15    METs 1.5      Prescription Details   Frequency (times per week) 2    Duration Progress to 30 minutes of continuous aerobic without signs/symptoms of physical distress      Intensity   THRR 40-80% of Max Heartrate 105-136    Ratings of Perceived Exertion 11-13    Perceived Dyspnea 0-4      Progression   Progression Continue to progress workloads to maintain intensity without signs/symptoms of physical distress.      Resistance Training   Training Prescription Yes    Weight 4 lb    Reps 10-15          Discharge Exercise Prescription (Final Exercise Prescription Changes):  Exercise Prescription Changes - 02/09/24 1400       Response to Exercise   Blood Pressure (Admit) 116/58    Blood Pressure (Exercise) 146/74    Blood Pressure (Exit) 118/66    Heart Rate (Admit) 75 bpm    Heart Rate (Exercise) 98 bpm    Heart Rate (Exit) 72 bpm    Oxygen  Saturation (Admit) 98 %    Oxygen Saturation (Exercise) 97 %    Oxygen Saturation (Exit) 100 %    Rating of Perceived Exertion (Exercise) 17    Perceived Dyspnea (Exercise) 3    Symptoms chronic back and hip pain 10/10, R leg numbness    Comments walk test results          Functional Capacity:  6 Minute Walk     Row Name 02/09/24 1451         6 Minute Walk   Phase Initial     Distance 890 feet     Walk Time 6 minutes     # of Rest Breaks 0     MPH 1.68     METS 2.79     RPE 17     Perceived Dyspnea  3     VO2 Peak 9.78     Symptoms Yes (comment)     Comments chronic back and hip pain 10/10,  R leg numbness     Resting HR 75 bpm     Resting BP 116/58     Resting Oxygen Saturation  98 %     Exercise Oxygen Saturation  during 6 min walk 97 %     Max Ex. HR 98 bpm     Max Ex. BP 146/74     2 Minute Post BP 132/60       Interval HR   1 Minute HR 95     2 Minute HR 92     3 Minute HR 94     4 Minute HR 94     5 Minute HR 98     6 Minute HR 90     2 Minute Post HR 71     Interval Heart Rate? Yes       Interval Oxygen   Interval Oxygen? Yes     Baseline Oxygen Saturation % 98 %     1 Minute Oxygen Saturation % 97 %     1 Minute Liters of Oxygen 0 L  Room Air     2 Minute Oxygen Saturation % 98 %     2 Minute Liters of Oxygen 0 L     3 Minute Oxygen Saturation % 98 %     3 Minute Liters of Oxygen 0 L     4 Minute Oxygen Saturation % 99 %     4 Minute Liters of Oxygen 0 L     5 Minute Oxygen Saturation % 100 %     5 Minute Liters of Oxygen 0 L     6 Minute Oxygen Saturation % 98 %     6 Minute Liters of Oxygen 0 L     2 Minute Post Oxygen Saturation % 100 %     2 Minute Post Liters of Oxygen 0 L        Psychological, QOL, Others - Outcomes: PHQ 2/9:    02/09/2024    1:05 PM  Depression screen PHQ 2/9  Decreased Interest 3  Down, Depressed, Hopeless 2  PHQ - 2 Score 5  Altered sleeping 1  Tired, decreased energy 3  Change in appetite 3  Feeling bad  or failure about yourself  1  Trouble concentrating 1  Moving slowly or fidgety/restless 1  Suicidal thoughts 0  PHQ-9 Score 15  Difficult doing work/chores Very difficult    Quality of Life:   Personal Goals: Goals established at orientation with interventions provided to work toward goal.  Personal Goals and Risk Factors at Admission - 02/09/24 1504       Core Components/Risk Factors/Patient Goals on Admission    Weight Management Yes;Weight Gain    Intervention Weight Management: Develop a combined nutrition and exercise program designed to reach desired caloric intake, while maintaining appropriate intake of nutrient and fiber, sodium and fats, and appropriate energy expenditure required for the weight goal.;Weight Management: Provide education and appropriate resources to help participant work on and attain dietary goals.    Admit Weight 124 lb 4.8 oz (56.4 kg)    Goal Weight: Short Term 128 lb (58.1 kg)    Goal Weight: Long Term 130 lb (59 kg)    Expected Outcomes Short Term: Continue to assess and modify interventions until short term weight is achieved;Long Term: Adherence to nutrition and physical activity/exercise program aimed toward attainment of established weight goal;Weight Gain: Understanding of general recommendations for a high calorie, high protein meal plan that promotes weight  gain by distributing calorie intake throughout the day with the consumption for 4-5 meals, snacks, and/or supplements    Improve shortness of breath with ADL's Yes    Intervention Provide education, individualized exercise plan and daily activity instruction to help decrease symptoms of SOB with activities of daily living.    Expected Outcomes Short Term: Improve cardiorespiratory fitness to achieve a reduction of symptoms when performing ADLs;Long Term: Be able to perform more ADLs without symptoms or delay the onset of symptoms    Increase knowledge of respiratory medications and ability to use  respiratory devices properly  Yes    Intervention Provide education and demonstration as needed of appropriate use of medications, inhalers, and oxygen therapy.    Expected Outcomes Short Term: Achieves understanding of medications use. Understands that oxygen is a medication prescribed by physician. Demonstrates appropriate use of inhaler and oxygen therapy.;Long Term: Maintain appropriate use of medications, inhalers, and oxygen therapy.    Stress Yes    Intervention Offer individual and/or small group education and counseling on adjustment to heart disease, stress management and health-related lifestyle change. Teach and support self-help strategies.;Refer participants experiencing significant psychosocial distress to appropriate mental health specialists for further evaluation and treatment. When possible, include family members and significant others in education/counseling sessions.    Expected Outcomes Short Term: Participant demonstrates changes in health-related behavior, relaxation and other stress management skills, ability to obtain effective social support, and compliance with psychotropic medications if prescribed.;Long Term: Emotional wellbeing is indicated by absence of clinically significant psychosocial distress or social isolation.           Personal Goals Discharge:   Exercise Goals and Review:  Exercise Goals     Row Name 02/02/24 1329 02/09/24 1456           Exercise Goals   Increase Physical Activity Yes Yes      Intervention Provide advice, education, support and counseling about physical activity/exercise needs.;Develop an individualized exercise prescription for aerobic and resistive training based on initial evaluation findings, risk stratification, comorbidities and participant's personal goals. Provide advice, education, support and counseling about physical activity/exercise needs.;Develop an individualized exercise prescription for aerobic and resistive training  based on initial evaluation findings, risk stratification, comorbidities and participant's personal goals.      Expected Outcomes Short Term: Attend rehab on a regular basis to increase amount of physical activity.;Long Term: Add in home exercise to make exercise part of routine and to increase amount of physical activity.;Long Term: Exercising regularly at least 3-5 days a week. Short Term: Attend rehab on a regular basis to increase amount of physical activity.;Long Term: Add in home exercise to make exercise part of routine and to increase amount of physical activity.;Long Term: Exercising regularly at least 3-5 days a week.      Increase Strength and Stamina Yes Yes      Intervention Provide advice, education, support and counseling about physical activity/exercise needs.;Develop an individualized exercise prescription for aerobic and resistive training based on initial evaluation findings, risk stratification, comorbidities and participant's personal goals. Provide advice, education, support and counseling about physical activity/exercise needs.;Develop an individualized exercise prescription for aerobic and resistive training based on initial evaluation findings, risk stratification, comorbidities and participant's personal goals.      Expected Outcomes Short Term: Increase workloads from initial exercise prescription for resistance, speed, and METs.;Short Term: Perform resistance training exercises routinely during rehab and add in resistance training at home;Long Term: Improve cardiorespiratory fitness, muscular endurance and strength as measured  by increased METs and functional capacity ( ) Short Term: Increase workloads from initial exercise prescription for resistance, speed, and METs.;Short Term: Perform resistance training exercises routinely during rehab and add in resistance training at home;Long Term: Improve cardiorespiratory fitness, muscular endurance and strength as measured by increased  METs and functional capacity ( )      Able to understand and use rate of perceived exertion (RPE) scale Yes Yes      Intervention Provide education and explanation on how to use RPE scale Provide education and explanation on how to use RPE scale      Expected Outcomes Short Term: Able to use RPE daily in rehab to express subjective intensity level;Long Term:  Able to use RPE to guide intensity level when exercising independently Short Term: Able to use RPE daily in rehab to express subjective intensity level;Long Term:  Able to use RPE to guide intensity level when exercising independently      Able to understand and use Dyspnea scale Yes Yes      Intervention Provide education and explanation on how to use Dyspnea scale Provide education and explanation on how to use Dyspnea scale      Expected Outcomes Short Term: Able to use Dyspnea scale daily in rehab to express subjective sense of shortness of breath during exertion;Long Term: Able to use Dyspnea scale to guide intensity level when exercising independently Short Term: Able to use Dyspnea scale daily in rehab to express subjective sense of shortness of breath during exertion;Long Term: Able to use Dyspnea scale to guide intensity level when exercising independently      Knowledge and understanding of Target Heart Rate Range (THRR) Yes Yes      Intervention Provide education and explanation of THRR including how the numbers were predicted and where they are located for reference Provide education and explanation of THRR including how the numbers were predicted and where they are located for reference      Expected Outcomes Short Term: Able to state/look up THRR;Short Term: Able to use daily as guideline for intensity in rehab;Long Term: Able to use THRR to govern intensity when exercising independently Short Term: Able to state/look up THRR;Short Term: Able to use daily as guideline for intensity in rehab;Long Term: Able to use THRR to govern  intensity when exercising independently      Able to check pulse independently Yes Yes      Intervention Provide education and demonstration on how to check pulse in carotid and radial arteries.;Review the importance of being able to check your own pulse for safety during independent exercise Provide education and demonstration on how to check pulse in carotid and radial arteries.;Review the importance of being able to check your own pulse for safety during independent exercise      Expected Outcomes Short Term: Able to explain why pulse checking is important during independent exercise;Long Term: Able to check pulse independently and accurately Short Term: Able to explain why pulse checking is important during independent exercise;Long Term: Able to check pulse independently and accurately      Understanding of Exercise Prescription Yes Yes      Intervention Provide education, explanation, and written materials on patient's individual exercise prescription Provide education, explanation, and written materials on patient's individual exercise prescription      Expected Outcomes Long Term: Able to explain home exercise prescription to exercise independently;Short Term: Able to explain program exercise prescription Long Term: Able to explain home exercise prescription to exercise independently;Short Term: Able to  explain program exercise prescription         Exercise Goals Re-Evaluation:   Nutrition & Weight - Outcomes:  Pre Biometrics - 02/09/24 1457       Pre Biometrics   Height 5' 7 (1.702 m)    Weight 124 lb 4.8 oz (56.4 kg)    Waist Circumference 31 inches    Hip Circumference 36 inches    Waist to Hip Ratio 0.86 %    BMI (Calculated) 19.46    Grip Strength 28.7 kg    Single Leg Stand 3.7 seconds           Nutrition:  Nutrition Therapy & Goals - 02/02/24 1333       Intervention Plan   Intervention Prescribe, educate and counsel regarding individualized specific dietary  modifications aiming towards targeted core components such as weight, hypertension, lipid management, diabetes, heart failure and other comorbidities.;Nutrition handout(s) given to patient.    Expected Outcomes Short Term Goal: Understand basic principles of dietary content, such as calories, fat, sodium, cholesterol and nutrients.;Short Term Goal: A plan has been developed with personal nutrition goals set during dietitian appointment.;Long Term Goal: Adherence to prescribed nutrition plan.          Nutrition Discharge:   Education Questionnaire Score:  Patient not able to afford co-payment.     [1]  Social History Tobacco Use  Smoking Status Some Days  Smokeless Tobacco Never

## 2024-04-29 NOTE — Telephone Encounter (Signed)
 Called patient's caregiver regarding pulmonary rehab attendance. She says he is not able to participate in the program at this time due to his co-payment. She request we discharge him. Informed her he could request another referral if his insurance changes. She verbalized understanding.

## 2024-04-29 NOTE — Progress Notes (Signed)
 Pulmonary Individual Treatment Plan  Patient Details  Name: Martin White MRN: [id number] Date of Birth: [birth year] Referring Provider:   Flowsheet Row PULMONARY REHAB OTHER RESP ORIENTATION from 02/09/2024 in [facility] CARDIAC REHABILITATION  Referring Provider Erlene Gearing MD    Initial Encounter Date:  Flowsheet Row PULMONARY REHAB OTHER RESP ORIENTATION from 02/09/2024 in [place] [facility] CARDIAC REHABILITATION  Date 02/09/24    Visit Diagnosis: Centrilobular emphysema (HCC)  Patient's Home Medications on Admission:  Current Medications[1]  Past Medical History: Past Medical History:  Diagnosis Date   COPD (chronic obstructive pulmonary disease) (HCC)    Degenerative cervical disc    Diabetes mellitus without complication (HCC)    Legally blind     Tobacco Use: Tobacco Use History[2]  Labs: Review Flowsheet        No data to display          Capillary Blood Glucose: No results found for: GLUCAP   Pulmonary Assessment Scores:  Pulmonary Assessment Scores     Row Name 02/09/24 1509         ADL UCSD   ADL Phase Entry       mMRC Score   mMRC Score 4       UCSD: Self-administered rating of dyspnea associated with activities of daily living (ADLs) 6-point scale (0 = not at all to 5 = maximal or unable to do because of breathlessness)  Scoring Scores range from 0 to 120.  Minimally important difference is 5 units  CAT: CAT can identify the health impairment of COPD patients and is better correlated with disease progression.  CAT has a scoring range of zero to 40. The CAT score is classified into four groups of low (less than 10), medium (10 - 20), high (21-30) and very high (31-40) based on the impact level of disease on health status. A CAT score over 10 suggests significant symptoms.  A worsening CAT score could be explained by an exacerbation, poor medication adherence, poor inhaler technique, or progression of COPD or comorbid conditions.   CAT MCID is 2 points  mMRC: mMRC (Modified Medical Research Council) Dyspnea Scale is used to assess the degree of baseline functional disability in patients of respiratory disease due to dyspnea. No minimal important difference is established. A decrease in score of 1 point or greater is considered a positive change.   Pulmonary Function Assessment:   Exercise Target Goals: Exercise Program Goal: Individual exercise prescription set using results from initial 6 min walk test and THRR while considering  patients activity barriers and safety.   Exercise Prescription Goal: Initial exercise prescription builds to 30-45 minutes a day of aerobic activity, 2-3 days per week.  Home exercise guidelines will be given to patient during program as part of exercise prescription that the participant will acknowledge.  Activity Barriers & Risk Stratification:  Activity Barriers & Cardiac Risk Stratification - 02/02/24 1308       Activity Barriers & Cardiac Risk Stratification   Activity Barriers History of Falls;Shortness of Breath;Neck/Spine Problems;Deconditioning;Balance Concerns;Other (comment)    Comments Chronic pain, mostly in back          6 Minute Walk:  6 Minute Walk     Row Name 02/09/24 1451         6 Minute Walk   Phase Initial     Distance 890 feet     Walk Time 6 minutes     # of Rest Breaks 0     MPH 1.68  METS 2.79     RPE 17     Perceived Dyspnea  3     VO2 Peak 9.78     Symptoms Yes (comment)     Comments chronic back and hip pain 10/10, R leg numbness     Resting HR 75 bpm     Resting BP 116/58     Resting Oxygen Saturation  98 %     Exercise Oxygen Saturation  during 6 min walk 97 %     Max Ex. HR 98 bpm     Max Ex. BP 146/74     2 Minute Post BP 132/60       Interval HR   1 Minute HR 95     2 Minute HR 92     3 Minute HR 94     4 Minute HR 94     5 Minute HR 98     6 Minute HR 90     2 Minute Post HR 71     Interval Heart Rate? Yes        Interval Oxygen   Interval Oxygen? Yes     Baseline Oxygen Saturation % 98 %     1 Minute Oxygen Saturation % 97 %     1 Minute Liters of Oxygen 0 L  Room Air     2 Minute Oxygen Saturation % 98 %     2 Minute Liters of Oxygen 0 L     3 Minute Oxygen Saturation % 98 %     3 Minute Liters of Oxygen 0 L     4 Minute Oxygen Saturation % 99 %     4 Minute Liters of Oxygen 0 L     5 Minute Oxygen Saturation % 100 %     5 Minute Liters of Oxygen 0 L     6 Minute Oxygen Saturation % 98 %     6 Minute Liters of Oxygen 0 L     2 Minute Post Oxygen Saturation % 100 %     2 Minute Post Liters of Oxygen 0 L        Oxygen Initial Assessment:  Oxygen Initial Assessment - 02/09/24 1509       Home Oxygen   Home Oxygen Device None    Sleep Oxygen Prescription None    Home Exercise Oxygen Prescription None    Home Resting Oxygen Prescription None      Initial 6 min Walk   Oxygen Used None      Program Oxygen Prescription   Program Oxygen Prescription None      Intervention   Short Term Goals To learn and understand importance of maintaining oxygen saturations>88%;To learn and demonstrate proper pursed lip breathing techniques or other breathing techniques. ;To learn and demonstrate proper use of respiratory medications;To learn and understand importance of monitoring SPO2 with pulse oximeter and demonstrate accurate use of the pulse oximeter.    Long  Term Goals Exhibits proper breathing techniques, such as pursed lip breathing or other method taught during program session;Compliance with respiratory medication;Demonstrates proper use of MDIs;Maintenance of O2 saturations>88%;Verbalizes importance of monitoring SPO2 with pulse oximeter and return demonstration          Oxygen Re-Evaluation:   Oxygen Discharge (Final Oxygen Re-Evaluation):   Initial Exercise Prescription:  Initial Exercise Prescription - 02/09/24 1400       Date of Initial Exercise RX and Referring Provider    Date 02/09/24    Referring  Provider Erlene Gearing MD      Oxygen   Maintain Oxygen Saturation 88% or higher      Treadmill   MPH 0.8    Grade 0    Minutes 15    METs 1.6      NuStep   Level 1    SPM 80    Minutes 15    METs 1.5      REL-XR   Level 1    Speed 50    Minutes 15    METs 1.5      Prescription Details   Frequency (times per week) 2    Duration Progress to 30 minutes of continuous aerobic without signs/symptoms of physical distress      Intensity   THRR 40-80% of Max Heartrate 105-136    Ratings of Perceived Exertion 11-13    Perceived Dyspnea 0-4      Progression   Progression Continue to progress workloads to maintain intensity without signs/symptoms of physical distress.      Resistance Training   Training Prescription Yes    Weight 4 lb    Reps 10-15          Perform Capillary Blood Glucose checks as needed.  Exercise Prescription Changes:   Exercise Prescription Changes     Row Name 02/09/24 1400             Response to Exercise   Blood Pressure (Admit) 116/58       Blood Pressure (Exercise) 146/74       Blood Pressure (Exit) 118/66       Heart Rate (Admit) 75 bpm       Heart Rate (Exercise) 98 bpm       Heart Rate (Exit) 72 bpm       Oxygen Saturation (Admit) 98 %       Oxygen Saturation (Exercise) 97 %       Oxygen Saturation (Exit) 100 %       Rating of Perceived Exertion (Exercise) 17       Perceived Dyspnea (Exercise) 3       Symptoms chronic back and hip pain 10/10, R leg numbness       Comments walk test results          Exercise Comments:   Exercise Comments     Row Name 02/02/24 1329           Exercise Comments Jens currently doesn't do any exercise at home. He does his ADL's but that's it.          Exercise Goals and Review:   Exercise Goals     Row Name 02/02/24 1329 02/09/24 1456           Exercise Goals   Increase Physical Activity Yes Yes      Intervention Provide advice, education,  support and counseling about physical activity/exercise needs.;Develop an individualized exercise prescription for aerobic and resistive training based on initial evaluation findings, risk stratification, comorbidities and participant's personal goals. Provide advice, education, support and counseling about physical activity/exercise needs.;Develop an individualized exercise prescription for aerobic and resistive training based on initial evaluation findings, risk stratification, comorbidities and participant's personal goals.      Expected Outcomes Short Term: Attend rehab on a regular basis to increase amount of physical activity.;Long Term: Add in home exercise to make exercise part of routine and to increase amount of physical activity.;Long Term: Exercising regularly at least 3-5 days a week. Short Term: Attend rehab  on a regular basis to increase amount of physical activity.;Long Term: Add in home exercise to make exercise part of routine and to increase amount of physical activity.;Long Term: Exercising regularly at least 3-5 days a week.      Increase Strength and Stamina Yes Yes      Intervention Provide advice, education, support and counseling about physical activity/exercise needs.;Develop an individualized exercise prescription for aerobic and resistive training based on initial evaluation findings, risk stratification, comorbidities and participant's personal goals. Provide advice, education, support and counseling about physical activity/exercise needs.;Develop an individualized exercise prescription for aerobic and resistive training based on initial evaluation findings, risk stratification, comorbidities and participant's personal goals.      Expected Outcomes Short Term: Increase workloads from initial exercise prescription for resistance, speed, and METs.;Short Term: Perform resistance training exercises routinely during rehab and add in resistance training at home;Long Term: Improve  cardiorespiratory fitness, muscular endurance and strength as measured by increased METs and functional capacity ( ) Short Term: Increase workloads from initial exercise prescription for resistance, speed, and METs.;Short Term: Perform resistance training exercises routinely during rehab and add in resistance training at home;Long Term: Improve cardiorespiratory fitness, muscular endurance and strength as measured by increased METs and functional capacity ( )      Able to understand and use rate of perceived exertion (RPE) scale Yes Yes      Intervention Provide education and explanation on how to use RPE scale Provide education and explanation on how to use RPE scale      Expected Outcomes Short Term: Able to use RPE daily in rehab to express subjective intensity level;Long Term:  Able to use RPE to guide intensity level when exercising independently Short Term: Able to use RPE daily in rehab to express subjective intensity level;Long Term:  Able to use RPE to guide intensity level when exercising independently      Able to understand and use Dyspnea scale Yes Yes      Intervention Provide education and explanation on how to use Dyspnea scale Provide education and explanation on how to use Dyspnea scale      Expected Outcomes Short Term: Able to use Dyspnea scale daily in rehab to express subjective sense of shortness of breath during exertion;Long Term: Able to use Dyspnea scale to guide intensity level when exercising independently Short Term: Able to use Dyspnea scale daily in rehab to express subjective sense of shortness of breath during exertion;Long Term: Able to use Dyspnea scale to guide intensity level when exercising independently      Knowledge and understanding of Target Heart Rate Range (THRR) Yes Yes      Intervention Provide education and explanation of THRR including how the numbers were predicted and where they are located for reference Provide education and explanation of THRR  including how the numbers were predicted and where they are located for reference      Expected Outcomes Short Term: Able to state/look up THRR;Short Term: Able to use daily as guideline for intensity in rehab;Long Term: Able to use THRR to govern intensity when exercising independently Short Term: Able to state/look up THRR;Short Term: Able to use daily as guideline for intensity in rehab;Long Term: Able to use THRR to govern intensity when exercising independently      Able to check pulse independently Yes Yes      Intervention Provide education and demonstration on how to check pulse in carotid and radial arteries.;Review the importance of being able to check your own pulse  for safety during independent exercise Provide education and demonstration on how to check pulse in carotid and radial arteries.;Review the importance of being able to check your own pulse for safety during independent exercise      Expected Outcomes Short Term: Able to explain why pulse checking is important during independent exercise;Long Term: Able to check pulse independently and accurately Short Term: Able to explain why pulse checking is important during independent exercise;Long Term: Able to check pulse independently and accurately      Understanding of Exercise Prescription Yes Yes      Intervention Provide education, explanation, and written materials on patient's individual exercise prescription Provide education, explanation, and written materials on patient's individual exercise prescription      Expected Outcomes Long Term: Able to explain home exercise prescription to exercise independently;Short Term: Able to explain program exercise prescription Long Term: Able to explain home exercise prescription to exercise independently;Short Term: Able to explain program exercise prescription         Exercise Goals Re-Evaluation :   Discharge Exercise Prescription (Final Exercise Prescription Changes):  Exercise  Prescription Changes - 02/09/24 1400       Response to Exercise   Blood Pressure (Admit) 116/58    Blood Pressure (Exercise) 146/74    Blood Pressure (Exit) 118/66    Heart Rate (Admit) 75 bpm    Heart Rate (Exercise) 98 bpm    Heart Rate (Exit) 72 bpm    Oxygen Saturation (Admit) 98 %    Oxygen Saturation (Exercise) 97 %    Oxygen Saturation (Exit) 100 %    Rating of Perceived Exertion (Exercise) 17    Perceived Dyspnea (Exercise) 3    Symptoms chronic back and hip pain 10/10, R leg numbness    Comments walk test results          Nutrition:  Target Goals: Understanding of nutrition guidelines, daily intake of sodium 1500mg , cholesterol 200mg , calories 30% from fat and 7% or less from saturated fats, daily to have 5 or more servings of fruits and vegetables.  Biometrics:  Pre Biometrics - 02/09/24 1457       Pre Biometrics   Height 5' 7 (1.702 m)    Weight 124 lb 4.8 oz (56.4 kg)    Waist Circumference 31 inches    Hip Circumference 36 inches    Waist to Hip Ratio 0.86 %    BMI (Calculated) 19.46    Grip Strength 28.7 kg    Single Leg Stand 3.7 seconds           Nutrition Therapy Plan and Nutrition Goals:  Nutrition Therapy & Goals - 02/02/24 1333       Intervention Plan   Intervention Prescribe, educate and counsel regarding individualized specific dietary modifications aiming towards targeted core components such as weight, hypertension, lipid management, diabetes, heart failure and other comorbidities.;Nutrition handout(s) given to patient.    Expected Outcomes Short Term Goal: Understand basic principles of dietary content, such as calories, fat, sodium, cholesterol and nutrients.;Short Term Goal: A plan has been developed with personal nutrition goals set during dietitian appointment.;Long Term Goal: Adherence to prescribed nutrition plan.          Nutrition Assessments:  MEDIFICTS Score Key: >=70 Need to make dietary changes  40-70 Heart Healthy  Diet <= 40 Therapeutic Level Cholesterol Diet  Flowsheet Row PULMONARY REHAB OTHER RESP ORIENTATION from 02/09/2024 in [facility] CARDIAC REHABILITATION  Picture Your Plate Total Score on Admission 63   Picture Your  Plate Scores: <59 Unhealthy dietary pattern with much room for improvement. 41-50 Dietary pattern unlikely to meet recommendations for good health and room for improvement. 51-60 More healthful dietary pattern, with some room for improvement.  >60 Healthy dietary pattern, although there may be some specific behaviors that could be improved.    Nutrition Goals Re-Evaluation:   Nutrition Goals Discharge (Final Nutrition Goals Re-Evaluation):   Psychosocial: Target Goals: Acknowledge presence or absence of significant depression and/or stress, maximize coping skills, provide positive support system. Participant is able to verbalize types and ability to use techniques and skills needed for reducing stress and depression.  Initial Review & Psychosocial Screening:  Initial Psych Review & Screening - 02/02/24 1334       Initial Review   Current issues with Current Stress Concerns    Source of Stress Concerns Chronic Illness    Comments Lyriq has a lot of co-morbidities that has caused him to be stressed. He states the last 2 years have been rough on him.      Family Dynamics   Good Support System? Yes    Comments Amirr has his friend Sari who is also is medical POA.      Barriers   Psychosocial barriers to participate in program The patient should benefit from training in stress management and relaxation.      Screening Interventions   Interventions Encouraged to exercise    Expected Outcomes Short Term goal: Utilizing psychosocial counselor, staff and physician to assist with identification of specific Stressors or current issues interfering with healing process. Setting desired goal for each stressor or current issue identified.;Long Term Goal: Stressors or current  issues are controlled or eliminated.;Short Term goal: Identification and review with participant of any Quality of Life or Depression concerns found by scoring the questionnaire.;Long Term goal: The participant improves quality of Life and PHQ9 Scores as seen by post scores and/or verbalization of changes          Quality of Life Scores:  Scores of 19 and below usually indicate a poorer quality of life in these areas.  A difference of  2-3 points is a clinically meaningful difference.  A difference of 2-3 points in the total score of the Quality of Life Index has been associated with significant improvement in overall quality of life, self-image, physical symptoms, and general health in studies assessing change in quality of life.  PHQ-9: Review Flowsheet       02/09/2024  Depression screen PHQ 2/9  Decreased Interest 3  Down, Depressed, Hopeless 2  PHQ - 2 Score 5  Altered sleeping 1  Tired, decreased energy 3  Change in appetite 3  Feeling bad or failure about yourself  1  Trouble concentrating 1  Moving slowly or fidgety/restless 1  Suicidal thoughts 0  PHQ-9 Score 15  Difficult doing work/chores Very difficult   Interpretation of Total Score  Total Score Depression Severity:  1-4 = Minimal depression, 5-9 = Mild depression, 10-14 = Moderate depression, 15-19 = Moderately severe depression, 20-27 = Severe depression   Psychosocial Evaluation and Intervention:  Psychosocial Evaluation - 02/02/24 1335       Psychosocial Evaluation & Interventions   Interventions Stress management education;Relaxation education;Encouraged to exercise with the program and follow exercise prescription    Comments Jacques is a pleasant 69 year old male who is coming into pulmonary rehab for emphysema. Tavi has a lot of co-morbities that contributes to his illness such as COPD, cirrhosis, chronic pain, a feeding tube  and being legally blind. He states he has had multiple falls in the last year  with no injury. He uses a cane to get around. He also states he uses a magnifying glass to help him read. He states he drives some to familiar places, but he will have his friend Sari drive him. His friend Sari is also his medical POA. He currently doesn't do any exercise at home, and he gets dizzy and lightheaded sometimes. He is eager to start the program to help him get better.    Expected Outcomes Short: Help SOB. Long: Get better and back to himself.    Continue Psychosocial Services  Follow up required by staff          Psychosocial Re-Evaluation:   Psychosocial Discharge (Final Psychosocial Re-Evaluation):   Education: Education Goals: Education classes will be provided on a weekly basis, covering required topics. Participant will state understanding/return demonstration of topics presented.  Learning Barriers/Preferences:  Learning Barriers/Preferences - 02/02/24 1333       Learning Barriers/Preferences   Learning Barriers Sight   legally blind   Learning Preferences Audio;Group Instruction;Individual Instruction;Pictoral;Skilled Demonstration          Education Topics: Know Your Numbers Group instruction that is supported by a PowerPoint presentation. Instructor discusses importance of knowing and understanding resting, exercise, and post-exercise oxygen saturation, heart rate, and blood pressure. Oxygen saturation, heart rate, blood pressure, rating of perceived exertion, and dyspnea are reviewed along with a normal range for these values.    Exercise for the Pulmonary Patient Group instruction that is supported by a PowerPoint presentation. Instructor discusses benefits of exercise, core components of exercise, frequency, duration, and intensity of an exercise routine, importance of utilizing pulse oximetry during exercise, safety while exercising, and options of places to exercise outside of rehab.    MET Level  Group instruction provided by PowerPoint, verbal  discussion, and written material to support subject matter. Instructor reviews what METs are and how to increase METs.    Pulmonary Medications Verbally interactive group education provided by instructor with focus on inhaled medications and proper administration.   Anatomy and Physiology of the Respiratory System Group instruction provided by PowerPoint, verbal discussion, and written material to support subject matter. Instructor reviews respiratory cycle and anatomical components of the respiratory system and their functions. Instructor also reviews differences in obstructive and restrictive respiratory diseases with examples of each.    Oxygen Safety Group instruction provided by PowerPoint, verbal discussion, and written material to support subject matter. There is an overview of What is Oxygen and Why do we need it.  Instructor also reviews how to create a safe environment for oxygen use, the importance of using oxygen as prescribed, and the risks of noncompliance. There is a brief discussion on traveling with oxygen and resources the patient may utilize.   Oxygen Use Group instruction provided by PowerPoint, verbal discussion, and written material to discuss how supplemental oxygen is prescribed and different types of oxygen supply systems. Resources for more information are provided.    Breathing Techniques Group instruction that is supported by demonstration and informational handouts. Instructor discusses the benefits of pursed lip and diaphragmatic breathing and detailed demonstration on how to perform both.     Risk Factor Reduction Group instruction that is supported by a PowerPoint presentation. Instructor discusses the definition of a risk factor, different risk factors for pulmonary disease, and how the heart and lungs work together.   Pulmonary Diseases Group instruction provided by PowerPoint, verbal  discussion, and written material to support subject matter.  Instructor gives an overview of the different type of pulmonary diseases. There is also a discussion on risk factors and symptoms as well as ways to manage the diseases.   Stress and Energy Conservation Group instruction provided by PowerPoint, verbal discussion, and written material to support subject matter. Instructor gives an overview of stress and the impact it can have on the body. Instructor also reviews ways to reduce stress. There is also a discussion on energy conservation and ways to conserve energy throughout the day.   Warning Signs and Symptoms Group instruction provided by PowerPoint, verbal discussion, and written material to support subject matter. Instructor reviews warning signs and symptoms of stroke, heart attack, cold and flu. Instructor also reviews ways to prevent the spread of infection.   Other Education Group or individual verbal, written, or video instructions that support the educational goals of the pulmonary rehab program.    Knowledge Questionnaire Score:   Core Components/Risk Factors/Patient Goals at Admission:  Personal Goals and Risk Factors at Admission - 02/09/24 1504       Core Components/Risk Factors/Patient Goals on Admission    Weight Management Yes;Weight Gain    Intervention Weight Management: Develop a combined nutrition and exercise program designed to reach desired caloric intake, while maintaining appropriate intake of nutrient and fiber, sodium and fats, and appropriate energy expenditure required for the weight goal.;Weight Management: Provide education and appropriate resources to help participant work on and attain dietary goals.    Admit Weight 124 lb 4.8 oz (56.4 kg)    Goal Weight: Short Term 128 lb (58.1 kg)    Goal Weight: Long Term 130 lb (59 kg)    Expected Outcomes Short Term: Continue to assess and modify interventions until short term weight is achieved;Long Term: Adherence to nutrition and physical activity/exercise program  aimed toward attainment of established weight goal;Weight Gain: Understanding of general recommendations for a high calorie, high protein meal plan that promotes weight gain by distributing calorie intake throughout the day with the consumption for 4-5 meals, snacks, and/or supplements    Improve shortness of breath with ADL's Yes    Intervention Provide education, individualized exercise plan and daily activity instruction to help decrease symptoms of SOB with activities of daily living.    Expected Outcomes Short Term: Improve cardiorespiratory fitness to achieve a reduction of symptoms when performing ADLs;Long Term: Be able to perform more ADLs without symptoms or delay the onset of symptoms    Increase knowledge of respiratory medications and ability to use respiratory devices properly  Yes    Intervention Provide education and demonstration as needed of appropriate use of medications, inhalers, and oxygen therapy.    Expected Outcomes Short Term: Achieves understanding of medications use. Understands that oxygen is a medication prescribed by physician. Demonstrates appropriate use of inhaler and oxygen therapy.;Long Term: Maintain appropriate use of medications, inhalers, and oxygen therapy.    Stress Yes    Intervention Offer individual and/or small group education and counseling on adjustment to heart disease, stress management and health-related lifestyle change. Teach and support self-help strategies.;Refer participants experiencing significant psychosocial distress to appropriate mental health specialists for further evaluation and treatment. When possible, include family members and significant others in education/counseling sessions.    Expected Outcomes Short Term: Participant demonstrates changes in health-related behavior, relaxation and other stress management skills, ability to obtain effective social support, and compliance with psychotropic medications if prescribed.;Long Term: Emotional  wellbeing is indicated  by absence of clinically significant psychosocial distress or social isolation.          Core Components/Risk Factors/Patient Goals Review:    Core Components/Risk Factors/Patient Goals at Discharge (Final Review):    ITP Comments:  ITP Comments     Row Name 02/02/24 1343 02/09/24 1448 03/02/24 1104 03/09/24 1349 03/30/24 1616   ITP Comments Completed virtual orientation today.  EP evaluation is scheduled for 02/09/24 at 1:00 .  Documentation for diagnosis can be found in [facility] encounter care everywhere, [facility]. Patient attend orientation today.  Patient is attending Pulmonary Rehabilitation Program.  Documentation for diagnosis can be found in [facility] 01/01/2024.  Reviewed medical chart, RPE/RPD, gym safety, and program guidelines.  Patient was fitted to equipment they will be using during rehab.  Patient is scheduled to start exercise on Tuesday March 02, 2024.   Initial ITP created and sent for review and signature by Dr. Anton Kelp, Medical Director for Pulmonary Rehabilitation Program. 30 day review completed. ITP sent to Dr.Jehanzeb Memon, Medical Director of  Pulmonary Rehab. Continue with ITP unless changes are made by physician.  Has yet to start exercise due to other appt.  Scheduled to start this afternoon. Called patient back.  He is having a hard time affording the program.  He has gotten approval for Medicaid and is hoping it will help pay. We will look into his insurance again to see what his cost would look like. 30 day review completed. ITP sent to Dr.Jehanzeb Memon, Medical Director of  Pulmonary Rehab. Continue with ITP unless changes are made by physician.  Still trying to decide about his insurance, last attended on 02/09/24    Row Name 04/27/24 1009 04/28/24 0959 04/29/24 1345       ITP Comments 30 day review completed. ITP sent to Dr.Jehanzeb Memon, Medical Director of  Pulmonary Rehab. Continue with ITP unless changes are made by physician.   Goals not obtained due to patient attendance. Have called him about starting and no response. We have called patient back about wanting to knoe his new insurance. He has not returned the phone calls and has not showed up to class. Will be sending patient a letter about discharge. Called patient's caregiver. She says he is not able to afford co-payment at this time and wants him to be discharged.        Comments: Discharge ITP     [1]  Current Outpatient Medications:    albuterol (ACCUNEB) 0.63 MG/3ML nebulizer solution, Inhale 0.63 mg into the lungs., Disp: , Rfl:    albuterol (ACCUNEB) 1.25 MG/3ML nebulizer solution, Inhale 1.25 mg into the lungs., Disp: , Rfl:    albuterol (VENTOLIN HFA) 108 (90 Base) MCG/ACT inhaler, Inhale 2 puffs into the lungs every 6 (six) hours as needed., Disp: , Rfl:    ammonium lactate (AMLACTIN) 12 % cream, Apply 1 Application topically as needed., Disp: , Rfl:    ascorbic acid (VITAMIN C) 1000 MG tablet, Take 1,000 mg by mouth daily., Disp: , Rfl:    B Complex Vitamins (VITAMIN B-COMPLEX) TABS, Take 1 tablet by mouth daily., Disp: , Rfl:    Carboxymethylcellulose Sod PF 1 % GEL, 1 drop., Disp: , Rfl:    Cholecalciferol 125 MCG (5000 UT) TABS, Take 5,000 Units by mouth., Disp: , Rfl:    diclofenac Sodium (VOLTAREN) 1 % GEL, Apply 2 g topically 4 (four) times daily., Disp: , Rfl:    gabapentin (NEURONTIN) 600 MG tablet, Take 600 mg by mouth  as needed., Disp: , Rfl:    ibuprofen (ADVIL) 200 MG tablet, Take 800 mg by mouth., Disp: , Rfl:    megestrol (MEGACE) 20 MG tablet, Take 40 mg by mouth 4 (four) times daily., Disp: , Rfl:    mirtazapine (REMERON) 7.5 MG tablet, Take 7.5 mg by mouth., Disp: , Rfl:    olopatadine (PATANOL) 0.1 % ophthalmic solution, Apply 1 drop to eye., Disp: , Rfl:    Olopatadine HCl 0.2 % SOLN, 1 drop., Disp: , Rfl:    omega-3 fish oil (MAXEPA) 1000 MG CAPS capsule, Take 1 capsule by mouth daily., Disp: , Rfl:    oxyCODONE  (ROXICODONE ) 15 MG  immediate release tablet, Take 15 mg by mouth every 6 (six) hours as needed., Disp: , Rfl:    potassium chloride SA (KLOR-CON M) 20 MEQ tablet, Take 20 mEq by mouth 2 (two) times daily., Disp: , Rfl:    TRELEGY ELLIPTA 100-62.5-25 MCG/ACT AEPB, Inhale 1 puff into the lungs daily., Disp: , Rfl:  [2]  Social History Tobacco Use  Smoking Status Some Days  Smokeless Tobacco Never

## 2024-05-04 ENCOUNTER — Encounter (HOSPITAL_COMMUNITY)

## 2024-05-04 NOTE — Telephone Encounter (Signed)
 Called patient about missing class today, left voicemail. Advised to call back with an update.

## 2024-05-06 ENCOUNTER — Encounter (HOSPITAL_COMMUNITY)

## 2024-05-11 ENCOUNTER — Encounter (HOSPITAL_COMMUNITY)

## 2024-05-13 ENCOUNTER — Encounter (HOSPITAL_COMMUNITY)

## 2024-05-18 ENCOUNTER — Encounter (HOSPITAL_COMMUNITY)

## 2024-05-20 ENCOUNTER — Encounter (HOSPITAL_COMMUNITY)

## 2024-05-25 ENCOUNTER — Encounter (HOSPITAL_COMMUNITY)

## 2024-05-27 ENCOUNTER — Encounter (HOSPITAL_COMMUNITY)

## 2024-06-01 ENCOUNTER — Encounter (HOSPITAL_COMMUNITY)

## 2024-06-03 ENCOUNTER — Encounter (HOSPITAL_COMMUNITY)

## 2024-06-08 ENCOUNTER — Encounter (HOSPITAL_COMMUNITY)

## 2024-06-10 ENCOUNTER — Encounter (HOSPITAL_COMMUNITY)

## 2024-06-15 ENCOUNTER — Encounter (HOSPITAL_COMMUNITY)

## 2024-06-17 ENCOUNTER — Encounter (HOSPITAL_COMMUNITY)

## 2024-06-22 ENCOUNTER — Encounter (HOSPITAL_COMMUNITY)

## 2024-06-24 ENCOUNTER — Encounter (HOSPITAL_COMMUNITY)

## 2024-06-29 ENCOUNTER — Encounter (HOSPITAL_COMMUNITY)

## 2024-07-01 ENCOUNTER — Encounter (HOSPITAL_COMMUNITY)

## 2024-07-06 ENCOUNTER — Encounter (HOSPITAL_COMMUNITY)

## 2024-07-08 ENCOUNTER — Encounter (HOSPITAL_COMMUNITY)
